# Patient Record
Sex: Female | Born: 1970 | Race: White | Hispanic: No | Marital: Married | Smoking: Current every day smoker
Health system: Southern US, Community
[De-identification: ages and names within clinical notes are randomized; demographics above are authoritative.]

## PROBLEM LIST (undated history)

## (undated) DIAGNOSIS — Z8632 Personal history of gestational diabetes: Secondary | ICD-10-CM

## (undated) DIAGNOSIS — F1910 Other psychoactive substance abuse, uncomplicated: Secondary | ICD-10-CM

## (undated) DIAGNOSIS — N946 Dysmenorrhea, unspecified: Secondary | ICD-10-CM

## (undated) DIAGNOSIS — Z972 Presence of dental prosthetic device (complete) (partial): Secondary | ICD-10-CM

## (undated) DIAGNOSIS — D649 Anemia, unspecified: Secondary | ICD-10-CM

## (undated) DIAGNOSIS — M5030 Other cervical disc degeneration, unspecified cervical region: Secondary | ICD-10-CM

## (undated) DIAGNOSIS — A64 Unspecified sexually transmitted disease: Secondary | ICD-10-CM

## (undated) DIAGNOSIS — J45909 Unspecified asthma, uncomplicated: Secondary | ICD-10-CM

## (undated) DIAGNOSIS — D069 Carcinoma in situ of cervix, unspecified: Secondary | ICD-10-CM

## (undated) DIAGNOSIS — I10 Essential (primary) hypertension: Secondary | ICD-10-CM

## (undated) DIAGNOSIS — N920 Excessive and frequent menstruation with regular cycle: Secondary | ICD-10-CM

## (undated) DIAGNOSIS — Z973 Presence of spectacles and contact lenses: Secondary | ICD-10-CM

## (undated) DIAGNOSIS — F1010 Alcohol abuse, uncomplicated: Secondary | ICD-10-CM

## (undated) HISTORY — DX: Anemia, unspecified: D64.9

## (undated) HISTORY — DX: Essential (primary) hypertension: I10

## (undated) HISTORY — DX: Other psychoactive substance abuse, uncomplicated: F19.10

## (undated) HISTORY — DX: Unspecified sexually transmitted disease: A64

---
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
## 1997-11-04 ENCOUNTER — Other Ambulatory Visit: Admission: RE | Admit: 1997-11-04 | Discharge: 1997-11-04 | Payer: Self-pay | Admitting: Obstetrics and Gynecology

## 1998-11-23 ENCOUNTER — Other Ambulatory Visit: Admission: RE | Admit: 1998-11-23 | Discharge: 1998-11-23 | Payer: Self-pay | Admitting: Obstetrics and Gynecology

## 1998-12-17 ENCOUNTER — Encounter (INDEPENDENT_AMBULATORY_CARE_PROVIDER_SITE_OTHER): Payer: Self-pay

## 1998-12-17 ENCOUNTER — Other Ambulatory Visit: Admission: RE | Admit: 1998-12-17 | Discharge: 1998-12-17 | Payer: Self-pay | Admitting: Obstetrics and Gynecology

## 1999-08-04 ENCOUNTER — Other Ambulatory Visit: Admission: RE | Admit: 1999-08-04 | Discharge: 1999-08-04 | Payer: Self-pay | Admitting: Obstetrics and Gynecology

## 1999-11-29 ENCOUNTER — Other Ambulatory Visit: Admission: RE | Admit: 1999-11-29 | Discharge: 1999-11-29 | Payer: Self-pay | Admitting: Obstetrics and Gynecology

## 2000-05-01 ENCOUNTER — Inpatient Hospital Stay (HOSPITAL_COMMUNITY): Admission: AD | Admit: 2000-05-01 | Discharge: 2000-05-01 | Payer: Self-pay | Admitting: Obstetrics and Gynecology

## 2000-07-20 ENCOUNTER — Inpatient Hospital Stay (HOSPITAL_COMMUNITY): Admission: AD | Admit: 2000-07-20 | Discharge: 2000-07-21 | Payer: Self-pay | Admitting: Obstetrics and Gynecology

## 2000-07-20 ENCOUNTER — Encounter (INDEPENDENT_AMBULATORY_CARE_PROVIDER_SITE_OTHER): Payer: Self-pay | Admitting: *Deleted

## 2000-07-23 ENCOUNTER — Encounter: Admission: RE | Admit: 2000-07-23 | Discharge: 2000-08-22 | Payer: Self-pay | Admitting: Obstetrics and Gynecology

## 2000-08-23 ENCOUNTER — Encounter: Admission: RE | Admit: 2000-08-23 | Discharge: 2000-09-22 | Payer: Self-pay | Admitting: Obstetrics and Gynecology

## 2000-12-20 ENCOUNTER — Other Ambulatory Visit: Admission: RE | Admit: 2000-12-20 | Discharge: 2000-12-20 | Payer: Self-pay | Admitting: Obstetrics and Gynecology

## 2001-07-02 ENCOUNTER — Encounter: Payer: Self-pay | Admitting: Internal Medicine

## 2001-07-02 ENCOUNTER — Encounter: Admission: RE | Admit: 2001-07-02 | Discharge: 2001-07-02 | Payer: Self-pay | Admitting: Internal Medicine

## 2002-01-23 ENCOUNTER — Other Ambulatory Visit: Admission: RE | Admit: 2002-01-23 | Discharge: 2002-01-23 | Payer: Self-pay | Admitting: Obstetrics and Gynecology

## 2003-11-24 ENCOUNTER — Encounter: Admission: RE | Admit: 2003-11-24 | Discharge: 2003-11-24 | Payer: Self-pay | Admitting: Internal Medicine

## 2004-08-01 ENCOUNTER — Emergency Department (HOSPITAL_COMMUNITY): Admission: EM | Admit: 2004-08-01 | Discharge: 2004-08-01 | Payer: Self-pay | Admitting: Emergency Medicine

## 2004-11-26 ENCOUNTER — Encounter: Admission: RE | Admit: 2004-11-26 | Discharge: 2004-11-26 | Payer: Self-pay | Admitting: Internal Medicine

## 2004-12-28 ENCOUNTER — Other Ambulatory Visit: Admission: RE | Admit: 2004-12-28 | Discharge: 2004-12-28 | Payer: Self-pay | Admitting: Internal Medicine

## 2005-09-23 ENCOUNTER — Emergency Department (HOSPITAL_COMMUNITY): Admission: EM | Admit: 2005-09-23 | Discharge: 2005-09-23 | Payer: Self-pay | Admitting: Emergency Medicine

## 2005-10-09 ENCOUNTER — Emergency Department (HOSPITAL_COMMUNITY): Admission: EM | Admit: 2005-10-09 | Discharge: 2005-10-10 | Payer: Self-pay | Admitting: Emergency Medicine

## 2005-10-21 ENCOUNTER — Ambulatory Visit (HOSPITAL_COMMUNITY): Admission: RE | Admit: 2005-10-21 | Discharge: 2005-10-21 | Payer: Self-pay | Admitting: Orthopaedic Surgery

## 2006-01-17 DIAGNOSIS — F319 Bipolar disorder, unspecified: Secondary | ICD-10-CM

## 2006-01-17 HISTORY — DX: Bipolar disorder, unspecified: F31.9

## 2006-08-17 ENCOUNTER — Inpatient Hospital Stay (HOSPITAL_COMMUNITY): Admission: AC | Admit: 2006-08-17 | Discharge: 2006-08-20 | Payer: Self-pay

## 2006-08-17 ENCOUNTER — Ambulatory Visit: Payer: Self-pay | Admitting: Family Medicine

## 2007-09-03 IMAGING — CR DG CHEST 1V PORT
1 series · 1 of 1 positions shown · non-contrast
Comparison: 08/17/06.

CLINICAL DATA: Ventilator patient. Pneumonia.
 PORTABLE CHEST - 1 VIEW (1220 hours):

[view not recorded]
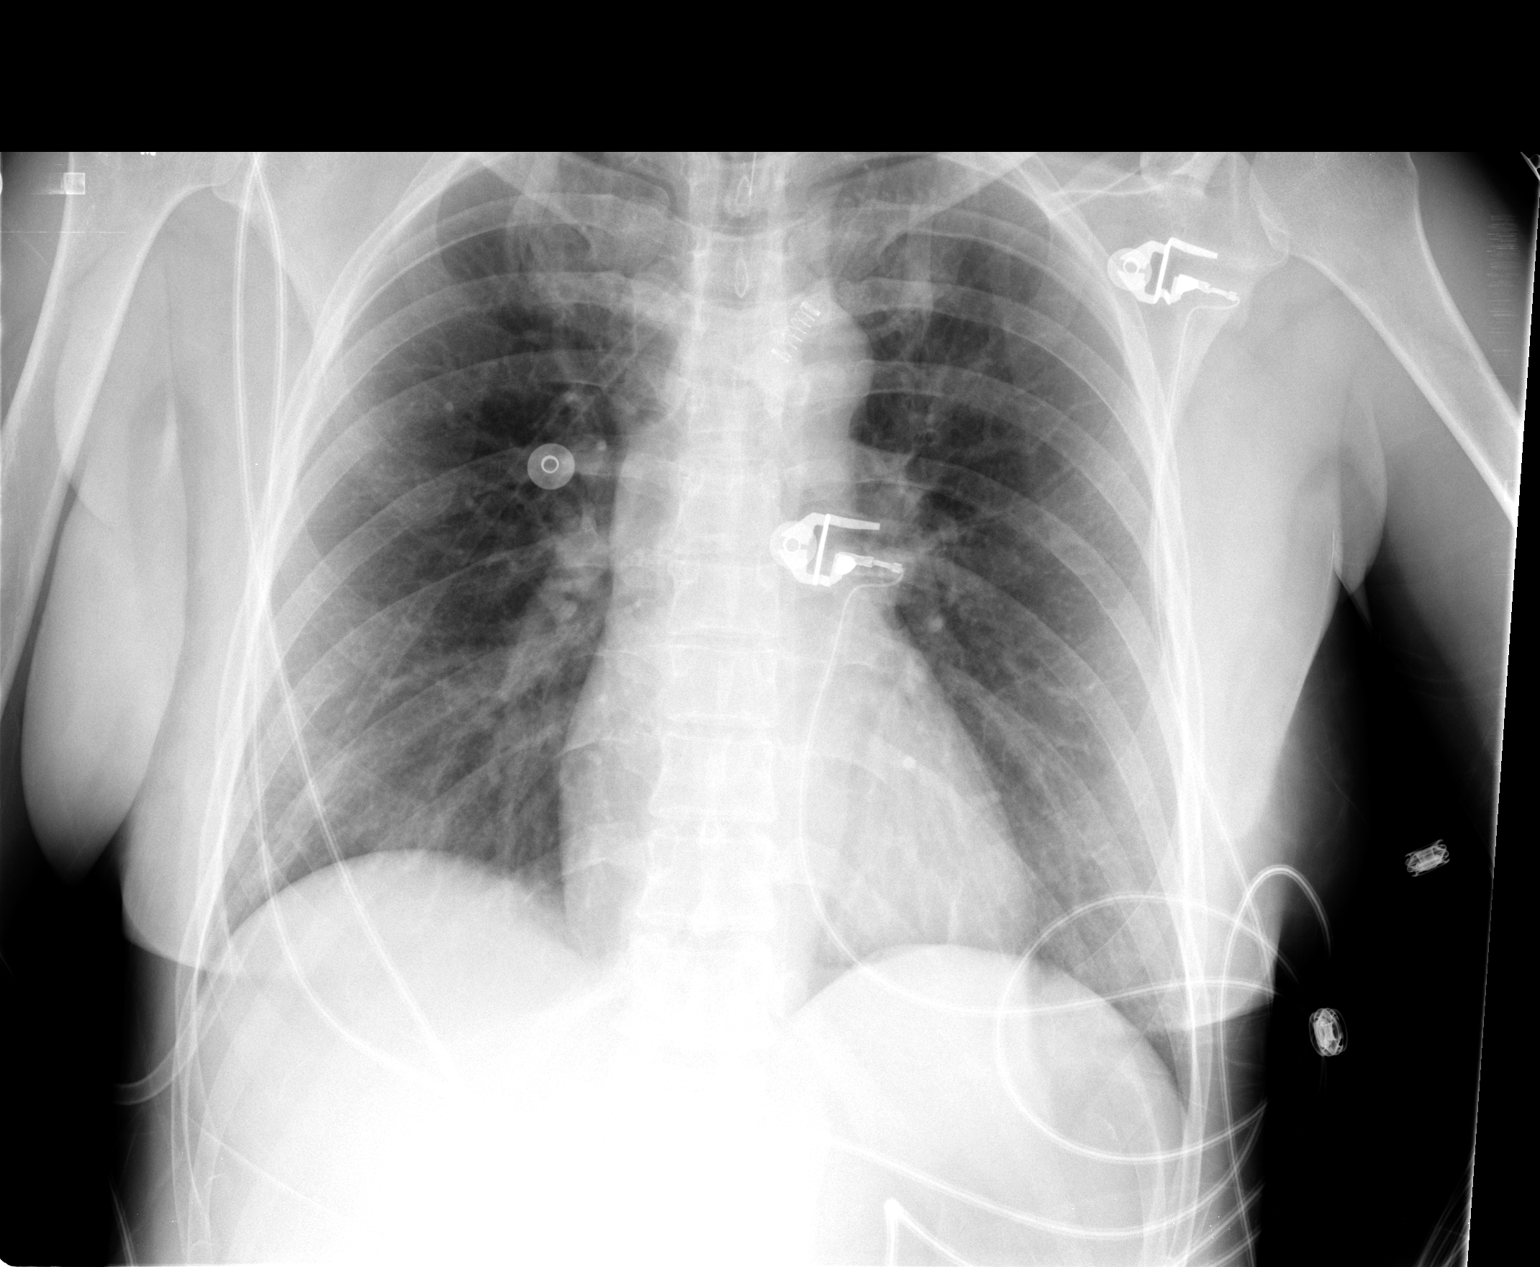

[1 of 1 positions shown; findings below may reference images not displayed]

FINDINGS: The endotracheal tube is in the mid trachea. The cardiomediastinal contours are stable. The lungs now appear clear. There is no pleural effusion or pneumothorax. No fractures are seen.
IMPRESSION: 1.   Endotracheal tube is well positioned.
 2.  No acute chest findings.

## 2007-12-10 ENCOUNTER — Ambulatory Visit: Payer: Self-pay | Admitting: Internal Medicine

## 2007-12-10 ENCOUNTER — Other Ambulatory Visit: Admission: RE | Admit: 2007-12-10 | Discharge: 2007-12-10 | Payer: Self-pay | Admitting: Internal Medicine

## 2008-02-08 ENCOUNTER — Ambulatory Visit: Payer: Self-pay | Admitting: Internal Medicine

## 2008-04-07 ENCOUNTER — Ambulatory Visit: Payer: Self-pay | Admitting: Internal Medicine

## 2008-10-24 ENCOUNTER — Ambulatory Visit: Payer: Self-pay | Admitting: Internal Medicine

## 2008-12-04 ENCOUNTER — Ambulatory Visit: Payer: Self-pay | Admitting: Internal Medicine

## 2008-12-04 ENCOUNTER — Other Ambulatory Visit: Admission: RE | Admit: 2008-12-04 | Discharge: 2008-12-04 | Payer: Self-pay | Admitting: Internal Medicine

## 2010-06-01 NOTE — Consult Note (Signed)
Destiny Travis, Destiny Travis              ACCOUNT NO.:  [id number]   MEDICAL RECORD NO.:  [id number]          PATIENT TYPE:  INP   LOCATION:  2116                         FACILITY:  [facility]   PHYSICIAN:  Pearlean Brownie, M.D.DATE OF BIRTH:  [birth year]   DATE OF CONSULTATION:  08/18/2006  DATE OF DISCHARGE:                                 CONSULTATION   PRIMARY CARE PHYSICIAN:  Dr. Lenord Fellers, who is medicine/pediatrics  in  [place].   PSYCHIATRIST:  Dr. Hortencia Pilar.   CHIEF COMPLAINT:  Found down after a fall, questionable seizure  activity.   HISTORY OF PRESENT ILLNESS:  Destiny Travis is a 40 year old with recent  diagnosis of bipolar and has been on lithium for 1 week.  She presented  to the emergency department via EMS after being found down with altered  mental status per report.  The patient was unresponsive and fell down  the steps around 20:30 and was found by her son.  Per EMS report, her  Glasgow Score on admission was 3, with some eyelid fluttering noted.  The patient was intubated in the ED secondary to concern for airway  protection.   PAST MEDICAL HISTORY:  1. Bipolar diagnosed 1 week ago.  2. Extensive history of substance abuse, including alcohol as well as      cocaine.  3. The patient has been in outpatient rehab at the [facility] from      October 2007 through December 2007, and most recently February 2008      through April 2008.  Patient also enrolled in Alcoholics Anonymous      from June 2008 and July 2008.  4. Has a significant history of depression.   ALLERGIES:  PENICILLIN and ERYTHROMYCIN, which cause shock per husband.   CURRENT MEDICATION BOTTLES:  1. Lithium 300 mg 1 tablet p.o. at bedtime, and written to increase to      2 tablets p.o. at bedtime.  2. Zoloft 25 mg p.o. daily.   SOCIAL HISTORY:  The patient lives with her husband.  They have an  adopted son who is 93 years old, a 35 year old son, as well as a 6-year-  old daughter.  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  Urine drug screen was positive for marijuana as well as cocaine today,  and alcohol level was elevated.  The patient's husband denies any  suicide attempts in the past, but states that she has struggled with  depression for the last 4-5 years.   FAMILY HISTORY:  Noncontributory.   PHYSICAL EXAMINATION:  VITAL SIGNS:  Temperature 97.8, blood pressure  158/101, heart rate 65, respirations 18.  The patient is saturating 100%  on ACVC, with a rate of 12, tidal volume of 550, FIO2 of 40%, and a PEEP  of 5.  GENERAL:  The patient opens her eyes spontaneously.  Mental status waxes  and wanes.  She is attempting to follow simple commands.  CARDIAC:  Bradycardic, with a rate of 60-65 beats per minute.  No  murmurs.  PULMONARY:  Occasional crackles on the left upper lobe were noted.  ABDOMEN:  Decreased bowel sounds, soft and nontender.  EXTREMITIES:  No edema.  No excoriations noted on the skin.  NEUROLOGIC:  Pupils equal, round, and reactive to light and  accommodation from 2 to 1 bilaterally.  Deep tendon reflexes are  present.  No Babinski's noted.   LABORATORY WORK:  Creatinine 0.9.  White blood cell count 6.8,  hemoglobin 14.8, platelets 277.  PT 13.7, INR 1.08.  Urinalysis:  Specific gravity of 1.019, negative glucose, negative protein, negative  leukocyte esterase and nitrites, 0-2 white blood cells and red blood  cells.  Alcohol level 143.  Urine drug screen positive for cocaine and  THC.  CT of the head and spine:  No acute intracranial findings.  No  evidence of acute fracture.  No subluxation.  No signs of instability.  There are some degenerative changes noted at C5 and C6.  Her EKG shows  normal sinus rhythm at 69 beats per minute, with a PR of 136 and a QT of  408.  Ammonia level is pending.  CMET is pending.  Urine pregnancy is  pending.  Fingerstick upon admission 125.  The patient's chest x-ray  revealed ET tube at the tip of the clavicle,  5.5 cm above the carina.  There is a hazy air space opacity on the left mid-upper lobe which could  possibly represent a contusion.   ASSESSMENT AND PLAN:  1. This is 40 year old.  2. Ventilator-dependent respiratory failure secondary to altered      mental status.  Most likely etiology is secondary to substance      induced.  Differential includes lithium toxicity as well as      overdose and possible alcohol withdrawal as well.  Given that her      UDS is positive for cocaine, we will cycle her cardiac enzymes q.8      h.. x3.  Obtain baseline EKG to ensure she does not have acute      coronary syndrome or prolonged QRS and QTc.  Given that she may      have a possible ingestion, we will check a Tylenol and salicylate      level as well as an osmole gap.  We will give her thiamine, given      that there is some concern for alcohol history, followed by folate,      and we will watch for signs of withdrawal and hold any sedative.      Her electrolytes are pending.  We will add an ammonia to ensure      that the patient does not have hepatic encephalopathy.  Her ABG was      stable on her initial vent settings, with pH of 7.4, a CO2 of 38.      Her initial head CT did not show any acute intracranial findings,      and no areas of acute fracture or subluxation in her cervical      spine.  She is currently in a cervical collar until she is cleared      by the trauma service.  The patient will likely need an extensive      psychiatric evaluation upon awakening, given her dual diagnosis of      bipolar and substance abuse.      Alanson Puls, M.D.  Electronically Signed      Pearlean Brownie, M.D.  Electronically Signed    MR/MEDQ  D:  08/18/2006  T:  08/18/2006  Job:  [id number]

## 2010-06-01 NOTE — Consult Note (Signed)
NAMETALINA, PLEITEZ              ACCOUNT NO.:  [id number]   MEDICAL RECORD NO.:  [id number]          PATIENT TYPE:  INP   LOCATION:  3731                         FACILITY:  [facility]   PHYSICIAN:  Antonietta Breach, M.D.  DATE OF BIRTH:  Jul 26, 1970   DATE OF CONSULTATION:  08/18/2006  DATE OF DISCHARGE:                                 CONSULTATION   REQUESTING PHYSICIAN:  Marta Lamas. Lindie Spruce, M.D.   REASON FOR CONSULTATION:  Potential complications from psychotropic  medication, relapse.   HISTORY OF PRESENT ILLNESS:  Destiny Travis is a 40 year old female  admitted to the [facility] on July 31 due to being unresponsive  status post fall.   The patient may have had a seizure.  She had been recently started on  lithium and Zoloft by her outpatient psychiatrist.  She has relapsed on  cocaine.  Her urine drug screen was positive for cocaine as well as  tetrahydrocannabinol.  Her alcohol level was still 143 by the time of  evaluation.   The patient does acknowledge depressed mood.  She has decreased energy  and decreased concentration; however, she denies thoughts of harming  herself or others.  She denies hallucinations or delusions.   PAST PSYCHIATRIC HISTORY:  The patient does describe several-day periods  of decreased need for sleep and increased energy as well as impaired  judgment and behaviors of risk while not under the influence of  stimulants.   As mentioned, she was started on lithium and Zoloft by her outpatient  psychiatrist within the past 2 weeks.  She denies any history of suicide  attempts.   She has recently gotten out of jail for embezzlement.   FAMILY PSYCHIATRIC HISTORY:  None known.   SOCIAL HISTORY:  The patient is married.  She and her husband are having  progressive arguments over her behavior and repeated cocaine relapse.  She states that he has decided that the marriage is over.  She has  children.  She is unemployed.  Her adopted son is 44 years old.  She  has  a 38 year old son as well as a 70-year-old daughter.  She was working as  a Social worker in a Administrator.   PAST MEDICAL HISTORY:  Fall with obtundation, possible seizure.   MEDICATIONS:  The MAR is reviewed.  The patient is on Haldol 2-8 mg IV  q.2h. p.r.n.   She has no known drug allergies.   LABORATORY DATA:  Urine HCG is negative.  Please see the above  discussion in the history of present illness.   REVIEW OF SYSTEMS:  Noncontributory.   MENTAL STATUS EXAM:  Ms. Starn is alert at the time of the exam.  Her  attention span is within normal limits.  Her concentration is mildly  decreased.  She is oriented to all spheres.  Her memory is intact to  immediate, recent and remote except for a blackout period which is  estimated at 12 hours.  This blackout period ended today earlier.  Her  fund of knowledge and intelligence are within normal limits.  Speech  involves normal rate and slightly  flat prosody.  Thought process:  Logical, coherent, goal-directed.  No looseness of associations.  Her  thought content:  No thoughts of harming herself, no thoughts of harming  others, no delusions, no hallucinations.  Insight is partial.  Judgment  is intact for the need of treatment.   ASSESSMENT:  AXIS I:  Rule out 296.80, bipolar order not otherwise  specified, depressed.  Polysubstance dependence.  AXIS II:  Deferred.  AXIS III:  See general medical problems.  AXIS IV:  Marital, addiction.  AXIS V:  35.   Ms. Cenci is in a pattern of symptoms of behavior that could result  in increased morbidity and potentially mortality.  She has depression to  the point that she would have difficulty with activities of daily  living.  Therefore, the undersigned recommends that she be admitted to a  psychiatric inpatient unit for a dual-diagnosis track.  The patient  concurs with this recommendation.   Would admit the patient to a psychiatric unit for further evaluation and  treatment as  described above when she is medically cleared.   Options include [facility], [place], or [facility].  The social worker can call [phone] for information on county  sponsorship.      Antonietta Breach, M.D.  Electronically Signed     JW/MEDQ  D:  08/19/2006  T:  08/19/2006  Job:  [id number]

## 2010-06-01 NOTE — H&P (Signed)
Destiny Travis              ACCOUNT NO.:  [id number]   MEDICAL RECORD NO.:  [id number]          PATIENT TYPE:  EMS   LOCATION:  MAJO                         FACILITY:  [facility]   PHYSICIAN:  Sharlet Salina T. Hoxworth, M.D.DATE OF BIRTH:  10/19/1969   DATE OF ADMISSION:  08/17/2006  DATE OF DISCHARGE:                              HISTORY & PHYSICAL   CHIEF COMPLAINT:  Fall, unresponsive.   PRESENT ILLNESS:  Destiny Travis is a 40 year old white female who was  found unresponsive at the bottom of the steps by family just prior to  being brought to the emergency room as a gold trauma.  Her son thought  he heard her fall.  He found her unresponsive.  EMS was called.  When  they arrived, she was breathing spontaneously but otherwise was  completely unresponsive.  She was brought to [facility] emergency room  as a Gold Trauma.  When I arrived, Dr. Stacie Acres was in the process of  intubating the patient.  At that time she had not yet received any  sedation.  She was completely unresponsive to painful stimuli.  She had  fluttering eyelids and clenched jaw.  Otherwise, no spontaneous  movement.  The extremities appeared flaccid.  The patient was sedated  and intubated due to GCS of 3 and history of possible fall and head  trauma.  Vital signs have been stable throughout.   PAST MEDICAL HISTORY:  Obtained from her husband.  She was diagnosed  with bipolar disorder a week ago and was started on lithium 1 week ago.  There is a history of drug abuse.  She has been in rehab recently.  Denies any other significant medical or surgical illnesses.   CURRENT MEDICATIONS:  1. Zoloft 25 mg q.h.s.  2. Lithium 300 mg q.h.s.   ALLERGIES:  PENICILLIN, E-MYCIN PER HUSBAND.   REVIEW OF SYSTEMS:  Positive psych for history of bipolar disorder, as  above, and history of drug abuse and recent rehab.  Otherwise, entirely  negative for any history of cardiac, pulmonary, GI.  Neurologic illness,  history of  seizures.   PHYSICAL EXAMINATION:  VITAL SIGNS:  Temperature is 97, heart rate 106,  respirations 20, blood pressure 133/90, O2 saturations 100%.  GENERAL:  Well-developed white female, unresponsive, as above.  SKIN:  Warm and dry.  HEENT:  No cranial swelling, bruising, or any evidence of trauma.  Face  without swelling or tenderness.  Pupils were 4 mm and reactive.  Oropharynx clear.  LUNGS:  Breath sounds clear and equal.  Chest without crepitance or  tenderness.  NECK:  Nontender.  No swelling.  Trachea midline.  CARDIOVASCULAR:  Regular tachycardia.  Peripheral pulses intact.  No JVD  or edema.  ABDOMEN:  Flat, soft, no apparent tenderness.  PELVIS:  Stable.  No abnormality.  MUSCULOSKELETAL:  No joint swelling, extremity deformity, crepitance.  BACK:  No stepoff deformity.  NEUROLOGIC:  GCS is 15.  Unresponsive to painful stimuli.  She has  fluttering eyelids bilaterally.  Pupils 4 mm and reactive.  Jaw  initially clenched.  Extremities appear flaccid without spontaneous  movement  initially.   LABORATORY:  CBG was in the 30s.  Electrolytes pending.  CBC normal.   Chest x-ray:  Haziness in the left lung.  Question contusion or  aspiration.  CT scan of the head and C-spine negative except for mild  DJD at C5-C6.  Tox screen returned positive for cocaine, THC.  Alcohol  level 146.   ASSESSMENT AND PLAN:  40 year old female brought in unresponsive,  questionable history of fall.  I find no evidence of trauma on physical  exam or on imaging.  There is possible seizure activity, as described  above.  Question if this is related to cocaine or possible lithium  overdose, as this was started recently.  Trauma seems much less likely.  She has an abnormal chest x-ray, question aspiration.   ADDENDUM:  2 hours after admission, the patient is arousing from  sedation on the ventilator, moving extremities x4, opening eyes  spontaneously without following commands.   The patient will  be admitted initially to the trauma service to the ICU.  Continue ventilator support for now, but it appears that she may be able  to be rapidly weaned from the ventilator.  Lithium level was pending.  Medical evaluation is in progress for other causes of her presentation  and/or seizures.      [name]. Hoxworth, M.D.  Electronically Signed     BTH/MEDQ  D:  08/17/2006  T:  08/18/2006  Job:  [id number]

## 2010-06-04 NOTE — Discharge Summary (Signed)
Destiny Travis, Destiny Travis              ACCOUNT NO.:  [id number]   MEDICAL RECORD NO.:  [id number]          PATIENT TYPE:  INP   LOCATION:  3731                         FACILITY:  [facility]   PHYSICIAN:  Asher Muir, MD   DATE OF BIRTH:  February 15, 1970   DATE OF ADMISSION:  08/17/2006  DATE OF DISCHARGE:  08/20/2006                               DISCHARGE SUMMARY   PRIMARY CARE Kailynn Satterly:  Dr. Lenord Fellers.   CONSULTANTS:  Dr. Jeanie Sewer in psychiatry.   PROCEDURES:  Intubation.   REASON FOR ADMISSION:  Patient is a 40 year old female found down with  altered mental status who had a GCS of 3 when EMS came to pick her up.   DISCHARGE DIAGNOSIS:  Altered mental status.   SECONDARY DIAGNOSES:  Bipolar disorder, recently diagnosed, substance  abuse versus addiction.   PERTINENT LABORATORIES:  Cardiac enzymes were negative x3, creatinine  0.9, UA negative, hemoglobin 14.8.  Urine drug screen:  Positive for  cocaine and marijuana.  Alcohol level:  143.  ABG:  PH 7.4, PCO2 38, PO2  501 and that was I believe intubated.  CT head and spine showed no acute  intracranial findings.  No evidence of acute fracture or subluxation or  instability.  Chest x-ray showed that endotracheal tube tip at the  clavicle head 5.5 cm above the corona and hazy air space opacities in  the left middle and upper line, could be contusion.  EKG showed normal  sinus rhythm at 69, QT 408.   DISCHARGE MEDICATIONS:  Zoloft 25 mg p.o. daily, lithium 300 mg p.o.  q.h.s.   HOSPITAL COURSE:  1. Altered mental status.  Patient was initially admitted to the      Trauma Service.  She was intubated for airway protection.  This was      managed by the Trauma Team.  They were able to extubate her on day      #2 of her admission, and her altered mental status may have been      secondary to a fall or more likely acute intoxication, as she was      positive for cocaine and alcohol and marijuana.  With the history      of depression and  bipolar, there was some concern that it may be a      suicide attempt.  Not likely to be lithium toxicity, as her lithium      level was less than 0.25.  Dr. Jeanie Sewer, psychiatrist, was called      in for a consult.  He evaluated the patient and recommended      inpatient rehab treatment program of dual diagnoses treating both      mental health issues and substance abuse.  This was presented to      the patient and to her husband, and they discussed options with the      social worked and decided that they would rather do outpatient      treatment specifically NA or AA, and they understand that the      recommendation from both the psychiatrist and the team was for  inpatient treatment.  Additionally, during her hospitalization,      there was some concern for possible alcohol withdrawal.  We do not      know how much alcohol she consumes on a normal basis.  A small dose      of Ativan was prescribed, and there were never any signs of alcohol      withdrawal.  Her vital signs all remained stable and primarily      within normal limits with the exception of 1 systolic blood      pressure of 172 and 1 diastolic blood pressure of 92.  All 3 sets      of cardiac enzymes were negative.   1. Pain.  Patient did have some headache and chest wall pain.  Chest      wall pain was likely secondary to the fall.  She was given Tylenol      and ibuprofen, which seemed to improve the pain.   1. Hypokalemia.  On the third day of hospitalization, the patient was      found to be slightly hypokalemic at 3.3.  She was given some      potassium chloride, and her potassium improved to within normal      limits.   1. Increased ammonia.  On day 3 of hospitalization, the patient's      ammonia was mildly increased at 44.  However, on the day of      discharge, it had trended down back to within normal limits at 31.   PATIENT CONDITION AT THE TIME OF DISCHARGE:  Stable.   PENDING TEST RESULTS AT THE  TIME OF DISCHARGE:  None.   DISPOSITION:  The patient was discharged home, as she chose not to go to  inpatient rehab.   DISCHARGE FOLLOWUP:  Patient was discharged on a Sunday.  Therefore,  unable to make appointments for her.  Patient was instructed to make an  appointment with her primary care Deronte Solis, Dr. Lenord Fellers, within the next  10 days.  Patient was strongly encouraged to make an appointment with  Dr. Hortencia Pilar, her psychiatrist at [facility], within 1  week.  Both those phone numbers, the patient has them, and they were  also placed on her discharge instruction form.   FOLLOWUP ISSUES:  Clearly this patient needs psychiatric treatment  including treatment for her depression and bipolar disorder and for her  substance abuse.  Whether or not she will seek this treatment is  questionable.  Of note, patient did not endorse suicidal thoughts or  action plan, but clearly she is in need of treatment in this area.      Asher Muir, MD  Electronically Signed     SO/MEDQ  D:  08/21/2006  T:  08/21/2006  Job:  [id number]   cc:   Luanna Cole. Lenord Fellers, M.D.  psychiatrist, [facility] Dr. Hortencia Pilar
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
## 2010-11-01 LAB — CARDIAC PANEL(CRET KIN+CKTOT+MB+TROPI)
CK, MB: 0.9
CK, MB: 1.4
Relative Index: INVALID
Troponin I: <0.01
Troponin I: <0.01

## 2010-11-01 LAB — COMPREHENSIVE METABOLIC PANEL
ALT: 12
ALT: 13
AST: 15
AST: 16
Albumin: 3.1 — ABNORMAL LOW
Albumin: 3.6
Alkaline Phosphatase: 65
Alkaline Phosphatase: 74
Chloride: 110
Chloride: 112
GFR calc Af Amer: >60
GFR calc Af Amer: >60
Potassium: 3.3 — ABNORMAL LOW
Potassium: 3.4 — ABNORMAL LOW
Sodium: 140
Sodium: 142
Total Bilirubin: 0.6
Total Bilirubin: 0.9

## 2010-11-01 LAB — I-STAT 8, (EC8 V) (CONVERTED LAB)
Bicarbonate: 23.4
Glucose, Bld: 101 — ABNORMAL HIGH
Sodium: 142
TCO2: 24
pCO2, Ven: 33.4 — ABNORMAL LOW
pH, Ven: 7.452 — ABNORMAL HIGH

## 2010-11-01 LAB — BASIC METABOLIC PANEL
BUN: 7
CO2: 26
Calcium: 8.4
Calcium: 8.8
Creatinine, Ser: 0.56
Creatinine, Ser: 0.58
GFR calc non Af Amer: >60
Glucose, Bld: 100 — ABNORMAL HIGH
Glucose, Bld: 91
Potassium: 3.3 — ABNORMAL LOW

## 2010-11-01 LAB — AMMONIA: Ammonia: 31

## 2010-11-01 LAB — CBC
HCT: 37.5
HCT: 38.8
HCT: 43.2
Hemoglobin: 12.2
Hemoglobin: 13.4
Hemoglobin: 14.8
MCHC: 33.8
MCHC: 34.2
MCV: 93.8
MCV: 92.4
Platelets: 217
RDW: 12.9
RDW: 12.9
RDW: 13.2
WBC: 13.2 — ABNORMAL HIGH

## 2010-11-01 LAB — CK TOTAL AND CKMB (NOT AT ARMC)
CK, MB: 1.4
Relative Index: 1.3

## 2010-11-01 LAB — POCT I-STAT 3, ART BLOOD GAS (G3+)
Acid-Base Excess: 1
Bicarbonate: 25.5 — ABNORMAL HIGH
O2 Saturation: 100
Operator id: 155271
pCO2 arterial: 38.7
pO2, Arterial: 501 — ABNORMAL HIGH

## 2010-11-01 LAB — URINALYSIS, MICROSCOPIC ONLY
Bilirubin Urine: NEGATIVE
Glucose, UA: NEGATIVE
Ketones, ur: NEGATIVE
Specific Gravity, Urine: 1.019
pH: 5.5

## 2010-11-01 LAB — RAPID URINE DRUG SCREEN, HOSP PERFORMED
Cocaine: POSITIVE — AB
Opiates: NOT DETECTED

## 2010-11-01 LAB — TROPONIN I: Troponin I: 0.01

## 2010-11-01 LAB — CULTURE, BLOOD (ROUTINE X 2)
Culture: NO GROWTH
Culture: NO GROWTH

## 2010-11-01 LAB — PREGNANCY, URINE: Preg Test, Ur: NEGATIVE

## 2010-11-01 LAB — POCT I-STAT CREATININE: Operator id: 272551

## 2010-11-01 LAB — OSMOLALITY: Osmolality: 310 — ABNORMAL HIGH

## 2013-07-15 ENCOUNTER — Other Ambulatory Visit: Payer: Self-pay | Admitting: Family Medicine

## 2013-07-15 DIAGNOSIS — Z1231 Encounter for screening mammogram for malignant neoplasm of breast: Secondary | ICD-10-CM

## 2013-07-31 ENCOUNTER — Ambulatory Visit: Payer: Self-pay

## 2018-03-21 ENCOUNTER — Emergency Department (HOSPITAL_COMMUNITY)
Admission: EM | Admit: 2018-03-21 | Discharge: 2018-03-21 | Disposition: A | Discharge status: Home / Self Care | Payer: BLUE CROSS/BLUE SHIELD | Attending: Emergency Medicine | Admitting: Emergency Medicine

## 2018-03-21 ENCOUNTER — Emergency Department (HOSPITAL_COMMUNITY): Payer: BLUE CROSS/BLUE SHIELD

## 2018-03-21 ENCOUNTER — Other Ambulatory Visit: Payer: Self-pay

## 2018-03-21 ENCOUNTER — Encounter (HOSPITAL_COMMUNITY): Payer: Self-pay | Admitting: *Deleted

## 2018-03-21 DIAGNOSIS — R110 Nausea: Secondary | ICD-10-CM | POA: Diagnosis not present

## 2018-03-21 DIAGNOSIS — Z79899 Other long term (current) drug therapy: Secondary | ICD-10-CM | POA: Insufficient documentation

## 2018-03-21 DIAGNOSIS — N83201 Unspecified ovarian cyst, right side: Secondary | ICD-10-CM | POA: Insufficient documentation

## 2018-03-21 DIAGNOSIS — F17228 Nicotine dependence, chewing tobacco, with other nicotine-induced disorders: Secondary | ICD-10-CM | POA: Diagnosis not present

## 2018-03-21 DIAGNOSIS — F17210 Nicotine dependence, cigarettes, uncomplicated: Secondary | ICD-10-CM | POA: Insufficient documentation

## 2018-03-21 DIAGNOSIS — F1210 Cannabis abuse, uncomplicated: Secondary | ICD-10-CM | POA: Insufficient documentation

## 2018-03-21 DIAGNOSIS — R109 Unspecified abdominal pain: Secondary | ICD-10-CM | POA: Insufficient documentation

## 2018-03-21 LAB — URINALYSIS, ROUTINE W REFLEX MICROSCOPIC
BILIRUBIN URINE: NEGATIVE
GLUCOSE, UA: NEGATIVE mg/dL
HGB URINE DIPSTICK: NEGATIVE
Ketones, ur: NEGATIVE mg/dL
Leukocytes,Ua: NEGATIVE
Nitrite: NEGATIVE
Protein, ur: NEGATIVE mg/dL
SPECIFIC GRAVITY, URINE: 1.018 (ref 1.005–1.030)
pH: 7 (ref 5.0–8.0)

## 2018-03-21 LAB — PREGNANCY, URINE: PREG TEST UR: NEGATIVE

## 2018-03-21 MED ORDER — LIDOCAINE 5 % EX PTCH: 1.0000 | MEDICATED_PATCH | CUTANEOUS | 0 refills | Status: DC

## 2018-03-21 MED ORDER — ONDANSETRON 4 MG PO TBDP
4.0000 mg | ORAL_TABLET | Freq: Once | ORAL | Status: AC
  Administered 2018-03-21: 4 mg via ORAL
  Filled 2018-03-21: qty 1

## 2018-03-21 MED ORDER — OXYCODONE-ACETAMINOPHEN 5-325 MG PO TABS
1.0000 | ORAL_TABLET | ORAL | Status: AC | PRN
  Administered 2018-03-21 (×2): 1 via ORAL
  Filled 2018-03-21 (×2): qty 1

## 2018-03-21 NOTE — ED Triage Notes (Signed)
Pt reports R flank pain x 1 month, the severity comes in waves.  She have been treated for UTI recently and have been on abx.  She went to her PCP today for same and was instructed to come to the ED to r/o kidney stones.  She reports nausea but no vomiting.

## 2018-03-21 NOTE — ED Notes (Signed)
Pain medication given in Triage. Patient advised about side effects of medications and  to avoid driving for a minimum of 4 hours.  Pt verbalized understanding. 

## 2018-03-21 NOTE — ED Notes (Signed)
US at bedside

## 2018-03-21 NOTE — ED Provider Notes (Signed)
[facility]-EMERGENCY DEPT Provider Note   CSN: [id number] Arrival date & time: 03/21/18  1602    History   Chief Complaint Chief Complaint  Patient presents with  . Flank Pain    HPI Destiny Travis is a 48 y.o. female who presents for evaluation of 1 month of her right flank pain.  Patient reports that approxi-1 month ago when symptoms started, she saw her primary care doctor.  At the time, she was diagnosed a UTI was given antibiotics which she completed.  Patient states that she felt better for about a day and then her symptoms returned.  She was given a second antibiotic which she states she completed.  Patient reports about a week ago, her flank pain started worsening and becoming more severe.  She states that it is kind of a constant "ache with an intermittent sharp pain that lasts for about 3 to 4 minutes."  She states that this pain is worse with movement and bending down.  Patient also reports that over the last week, she has had some decrease sensation to her right paraspinal muscles into the top part of her thigh.  She states she still been able to walk without any difficulty denies any urinary or bowel incontinence, saddle anesthesia.  She states she has been taking Robaxin at night with no improvement in her symptoms.  Patient states her urine is still cloudy but she has not had any dysuria, hematuria.  Patient also reports she has been having some intermittent episodes of nausea only with the pain.  She denies any vomiting.  Patient denies any fever, history of IV drug use, history of back surgery, difficulty walking, numbness/weakness of arms or legs, urinary or bowel incontinence, saddle anesthesia, chest pain, difficulty breathing.    The history is provided by the patient.    History reviewed. No pertinent past medical history.  There are no active problems to display for this patient.   History reviewed. No pertinent surgical history.   OB History     No obstetric history on file.      Home Medications    Prior to Admission medications   Medication Sig Start Date End Date Taking? Authorizing Provider  Cholecalciferol (VITAMIN D3) 25 MCG (1000 UT) CAPS Take 1 capsule by mouth daily. 11/30/17  Yes [provider]  diclofenac (VOLTAREN) 75 MG EC tablet Take 75 mg by mouth 2 (two) times daily as needed for mild pain or moderate pain.   Yes [provider]  methocarbamol (ROBAXIN) 500 MG tablet Take 500-1,000 mg by mouth every 6 (six) hours as needed for muscle spasms.  03/09/18  Yes [provider]  lidocaine (LIDODERM) 5 % Place 1 patch onto the skin daily. Remove & Discard patch within 12 hours or as directed by MD 03/21/18   Maxwell Caul, PA-C    Family History No family history on file.  Social History Social History   Tobacco Use  . Smoking status: Current Every Day Smoker    Packs/day: 1.00    Types: Cigarettes  . Smokeless tobacco: Current User  Substance Use Topics  . Alcohol use: Yes    Alcohol/week: 2.0 standard drinks    Types: 2 Standard drinks or equivalent per week  . Drug use: Yes    Types: Marijuana     Allergies   Penicillins and Erythromycin   Review of Systems Review of Systems  Constitutional: Negative for fever.  Respiratory: Negative for cough and shortness of  breath.   Cardiovascular: Negative for chest pain.  Gastrointestinal: Negative for abdominal pain, nausea and vomiting.  Genitourinary: Positive for flank pain. Negative for dysuria and hematuria.  Musculoskeletal: Positive for back pain.  Neurological: Positive for numbness. Negative for weakness and headaches.  All other systems reviewed and are negative.    Physical Exam Updated Vital Signs BP (!) 157/94 (BP Location: Left Arm)   Pulse 78   Temp 98.2 F (36.8 C) (Oral)   Resp 18   SpO2 99%   Physical Exam Vitals signs and nursing note reviewed.  Constitutional:      Appearance: Normal  appearance. She is well-developed.  HENT:     Head: Normocephalic and atraumatic.  Eyes:     General: Lids are normal.     Conjunctiva/sclera: Conjunctivae normal.     Pupils: Pupils are equal, round, and reactive to light.  Neck:     Musculoskeletal: Full passive range of motion without pain. No spinous process tenderness.  Cardiovascular:     Rate and Rhythm: Normal rate and regular rhythm.     Pulses: Normal pulses.          Radial pulses are 2+ on the right side and 2+ on the left side.       Dorsalis pedis pulses are 2+ on the right side and 2+ on the left side.     Heart sounds: Normal heart sounds. No murmur. No friction rub. No gallop.   Pulmonary:     Effort: Pulmonary effort is normal.     Breath sounds: Normal breath sounds.     Comments: Lungs clear to auscultation bilaterally.  Symmetric chest rise.  No wheezing, rales, rhonchi. Abdominal:     Palpations: Abdomen is soft. Abdomen is not rigid.     Tenderness: There is abdominal tenderness in the right lower quadrant. There is right CVA tenderness. There is no guarding. Negative signs include McBurney's sign.  Musculoskeletal: Normal range of motion.     Thoracic back: She exhibits no tenderness.     Lumbar back: She exhibits no tenderness.       Back:     Comments: No midline T or L-spine tenderness.  No deformity or crepitus.  Diffuse muscular tenderness noted to the paraspinal muscles of the lumbar region that extend anteriorly.   Skin:    General: Skin is warm and dry.     Capillary Refill: Capillary refill takes less than 2 seconds.     Comments: No overlying rash.  Neurological:     Mental Status: She is alert and oriented to person, place, and time.     Comments: Follows commands, Moves all extremities  5/5 strength to BUE and BLE  Decreased sensation noted to the right paraspinal muscles that extend just over to the right lower extremity.  Otherwise sensation intact throughout all major nerve  distributions Normal gait Negative SLR  Psychiatric:        Speech: Speech normal.      ED Treatments / Results  Labs (all labs ordered are listed, but only abnormal results are displayed) Labs Reviewed  URINALYSIS, ROUTINE W REFLEX MICROSCOPIC - Abnormal; Notable for the following components:      Result Value   APPearance CLOUDY (*)    All other components within normal limits  PREGNANCY, URINE    EKG None  Radiology US Transvaginal Non-ob  Result Date: 03/21/2018 CLINICAL DATA:  Pelvic pain for 1 month. EXAM: TRANSABDOMINAL AND TRANSVAGINAL ULTRASOUND OF PELVIS TECHNIQUE: Both  transabdominal and transvaginal ultrasound examinations of the pelvis were performed. Transabdominal technique was performed for global imaging of the pelvis including uterus, ovaries, adnexal regions, and pelvic cul-de-sac. It was necessary to proceed with endovaginal exam following the transabdominal exam to visualize the uterus, ovaries, and adnexa. COMPARISON:  None FINDINGS: Uterus Measurements: 6.9 x 3.6 x 4.0 cm = volume: 51.7 mL. No fibroids or other mass visualized. Endometrium Thickness: 3 mm, normal.  No focal abnormality visualized. Right ovary Measurements: 3.1 x 1.1 x 1.7 cm = volume: 2.95 mL. There is a 1.2 cm follicular cyst in the right ovary. Blood flow is noted. No adnexal mass. Left ovary Measurements: 2.6 x 0.9 x 1.8 cm = volume: 2.0 mL. There is a 1.5 cm cyst in the left ovary. Blood flow is noted. No adnexal mass. Other findings No abnormal free fluid. IMPRESSION: 1. Normal sonographic appearance of both ovaries with physiologic follicular cysts, largest measuring 1.5 cm. No dedicated imaging follow-up is needed. 2. Normal sonographic appearance of the uterus and endometrium. Electronically Signed   By: Narda Rutherford M.D.   On: 03/21/2018 21:02   US Pelvis Complete  Result Date: 03/21/2018 CLINICAL DATA:  Pelvic pain for 1 month. EXAM: TRANSABDOMINAL AND TRANSVAGINAL ULTRASOUND OF PELVIS  TECHNIQUE: Both transabdominal and transvaginal ultrasound examinations of the pelvis were performed. Transabdominal technique was performed for global imaging of the pelvis including uterus, ovaries, adnexal regions, and pelvic cul-de-sac. It was necessary to proceed with endovaginal exam following the transabdominal exam to visualize the uterus, ovaries, and adnexa. COMPARISON:  None FINDINGS: Uterus Measurements: 6.9 x 3.6 x 4.0 cm = volume: 51.7 mL. No fibroids or other mass visualized. Endometrium Thickness: 3 mm, normal.  No focal abnormality visualized. Right ovary Measurements: 3.1 x 1.1 x 1.7 cm = volume: 2.95 mL. There is a 1.2 cm follicular cyst in the right ovary. Blood flow is noted. No adnexal mass. Left ovary Measurements: 2.6 x 0.9 x 1.8 cm = volume: 2.0 mL. There is a 1.5 cm cyst in the left ovary. Blood flow is noted. No adnexal mass. Other findings No abnormal free fluid. IMPRESSION: 1. Normal sonographic appearance of both ovaries with physiologic follicular cysts, largest measuring 1.5 cm. No dedicated imaging follow-up is needed. 2. Normal sonographic appearance of the uterus and endometrium. Electronically Signed   By: Narda Rutherford M.D.   On: 03/21/2018 21:02   US Renal  Result Date: 03/21/2018 CLINICAL DATA:  Right-sided flank pain, recurrent UTI EXAM: RENAL / URINARY TRACT ULTRASOUND COMPLETE COMPARISON:  None. FINDINGS: Right Kidney: Renal measurements: 10.4 x 4.4 x 5.6 cm = volume: 133 mL . Echogenicity within normal limits. No mass or hydronephrosis visualized. Left Kidney: Renal measurements: 11.8 x 5 x 5 cm = volume: 155.3 mL. Echogenicity within normal limits. No mass or hydronephrosis visualized. Bladder: Small amount of debris in the bladder. IMPRESSION: 1. Negative ultrasound appearance of the kidneys 2. Small amount of debris in the bladder. Bladder is otherwise normal in appearance. Electronically Signed   By: Jasmine Pang M.D.   On: 03/21/2018 21:03     Procedures Procedures (including critical care time)  Medications Ordered in ED Medications  oxyCODONE-acetaminophen (PERCOCET/ROXICET) 5-325 MG per tablet 1 tablet (1 tablet Oral Given 03/21/18 2216)  ondansetron (ZOFRAN-ODT) disintegrating tablet 4 mg (4 mg Oral Given 03/21/18 1651)     Initial Impression / Assessment and Plan / ED Course  I have reviewed the triage vital signs and the nursing notes.  Pertinent labs &  imaging results that were available during my care of the patient were reviewed by me and considered in my medical decision making (see chart for details).        48 y.o. female who presents for evaluation of 1 month of right flank pain.  She reports worsened over the last week.  She saw PCP diagnosed with UTI and she has been on 2 rounds of antibiotics.  Reports she continues to have right flank pain.  She reports she also has some numbness to that area.  She still been able to ambulate.  No fevers, urinary or bowel incontinence, saddle anesthesia.  She denies any dysuria, hematuria. Patient is afebrile, non-toxic appearing, sitting comfortably on examination table. Vital signs reviewed and stable.  On exam, she has tenderness noted to the right paraspinal muscles of the lumbar region/CVA tenderness.  She reports some decrease sensation to that area but sensation otherwise intact.  No weakness of her bilateral lower extremities.  Negative SLE.  Low suspicion for kidney stone given duration of symptoms x1 month.  Consider musculoskeletal pain.  Also consider ovarian cyst.  Given that this is been ongoing for the last month, do not suspect ovarian torsion.  Additionally, her abdominal exam is not concerning for ovarian torsion.  Urine ordered at triage.  Will plan for ultrasound renal, ultrasound pelvic for evaluation.  Also consider musculoskeletal pain versus sciatica.  History/physical exam not concerning for cauda equina, spinal abscess, aortic dissection.  Ultrasound of  renal shows no evidence of kidney stones.  Pelvic ultrasound shows cyst in right ovary measuring approximate 1.2 cm.  Additionally, there is a cyst in the left ovary measuring 1.5 cm.  No other acute abnormalities.  Discussed results with patient.  Instructed patient to follow-up with her OB/GYN regarding ovarian cyst.  Patient able to ambulate in the ED without any difficulty. At this time, patient exhibits no emergent life-threatening condition that require further evaluation in ED or admission. Patient had ample opportunity for questions and discussion. All patient's questions were answered with full understanding. Strict return precautions discussed. Patient expresses understanding and agreement to plan.   Portions of this note were generated with Scientist, clinical (histocompatibility and immunogenetics). Dictation errors may occur despite best attempts at proofreading.   Final Clinical Impressions(s) / ED Diagnoses   Final diagnoses:  Right flank pain  Cyst of right ovary    ED Discharge Orders         Ordered    lidocaine (LIDODERM) 5 %  Every 24 hours     03/21/18 2200           Maxwell Caul, PA-C 03/22/18 0014    Derwood Kaplan, MD 03/22/18 1514

## 2018-03-21 NOTE — Discharge Instructions (Addendum)
As we discussed, there is no evidence of kidney stones on your ultrasound.  There was a 1.5 cm cyst on the left ovary as well as a 1.2 cm cyst on the right ovary.  This could be contributing to her pain.  Please follow-up with your OB/GYN regarding the symptoms.  You can take Tylenol or Ibuprofen as directed for pain. You can alternate Tylenol and Ibuprofen every 4 hours. If you take Tylenol at 1pm, then you can take Ibuprofen at 5pm. Then you can take Tylenol again at 9pm.   Take Robaxin as prescribed. This medication will make you drowsy so do not drive or drink alcohol when taking it.  Lidocaine patches as directed.  Follow-up with your primary care doctor as directed.  Return emergency part for any fever, worsening pain, difficulty walking, numbness/weakness of your arms or legs or any other worsening or concerning symptoms.

## 2019-04-06 IMAGING — US US TRANSVAGINAL NON-OB
1 series · 13 of 25 positions shown · non-contrast
Comparison: None

CLINICAL DATA: Pelvic pain for 1 month.

EXAM:
TRANSABDOMINAL AND TRANSVAGINAL ULTRASOUND OF PELVIS
TECHNIQUE: Both transabdominal and transvaginal ultrasound examinations of the
pelvis were performed. Transabdominal technique was performed for
global imaging of the pelvis including uterus, ovaries, adnexal
regions, and pelvic cul-de-sac. It was necessary to proceed with
endovaginal exam following the transabdominal exam to visualize the
uterus, ovaries, and adnexa..

[Series 1: us transvaginal non-ob · 13 of 41 slices shown]
[im 1/41]
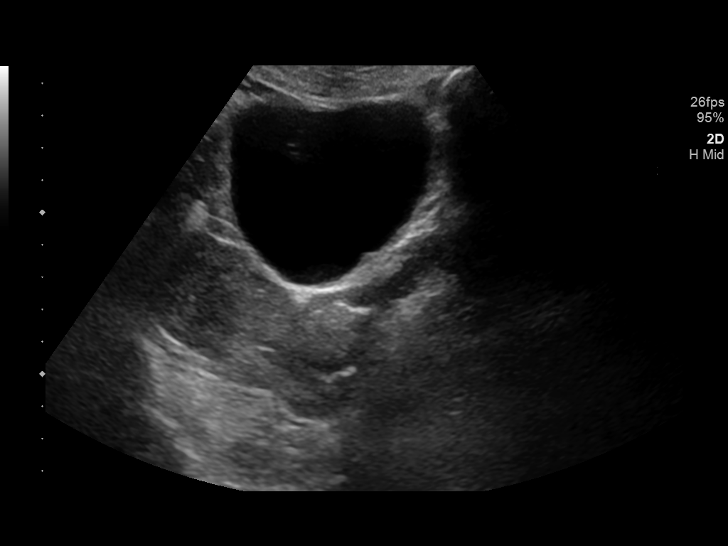
[im 4/41]
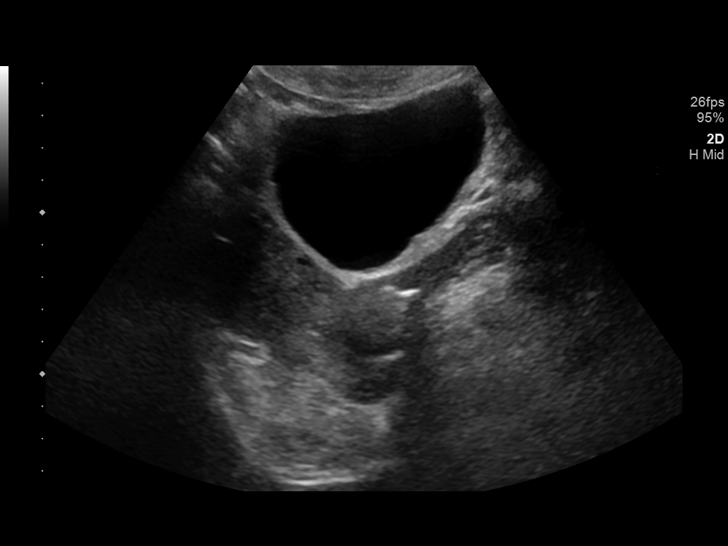
[im 7/41]
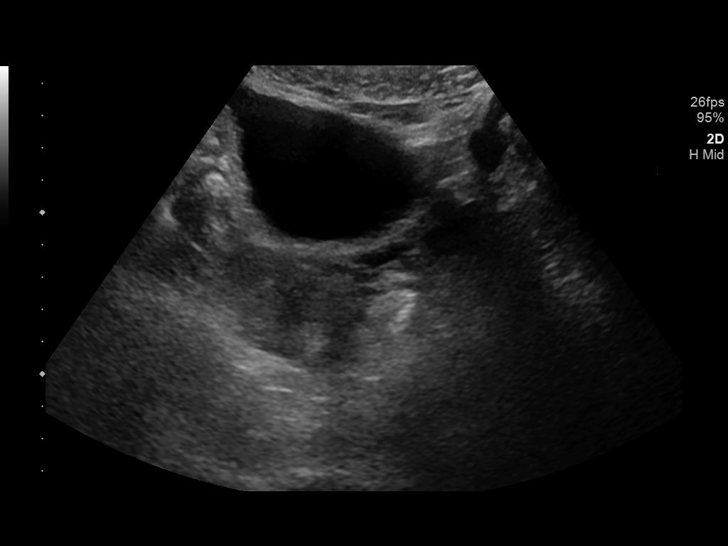
[im 11/41]
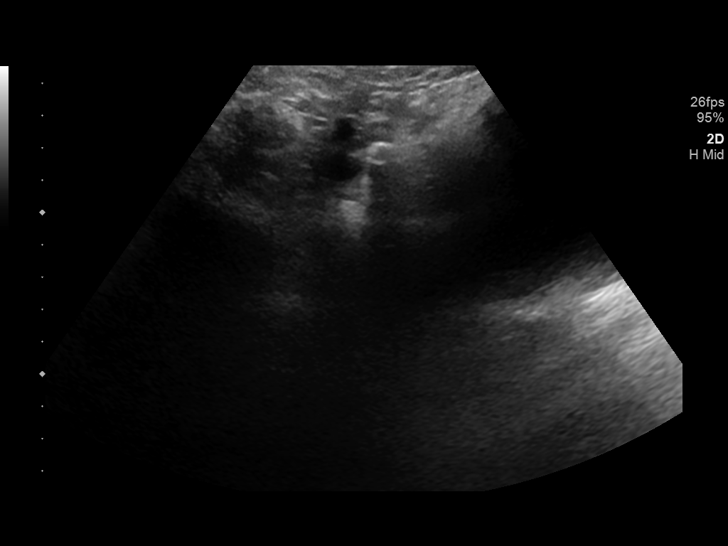
[im 14/41]
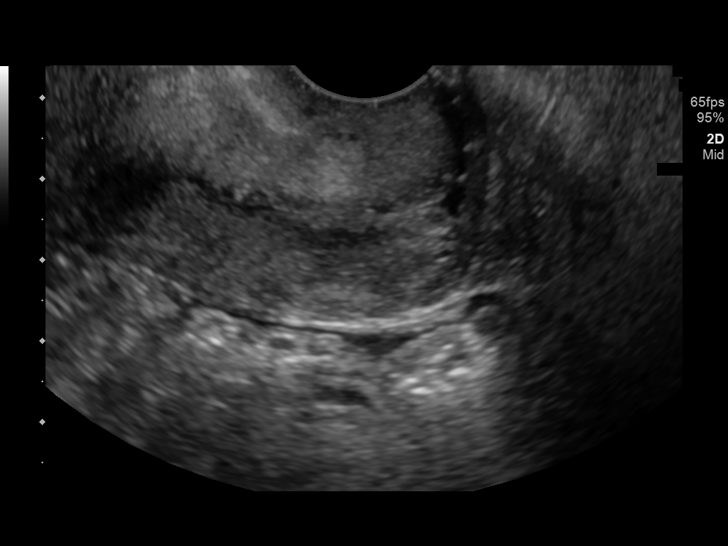
[im 17/41]
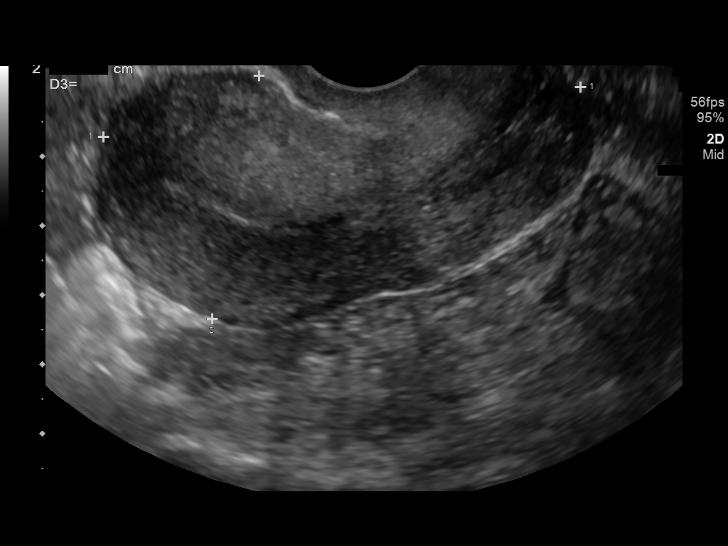
[im 21/41]
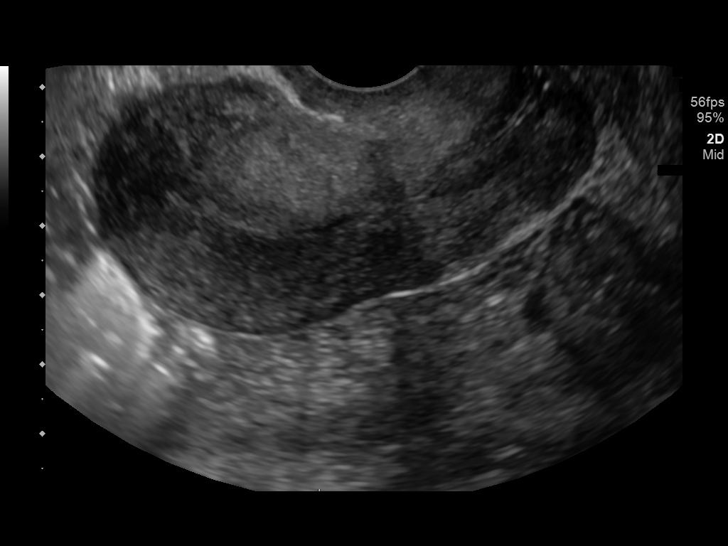
[im 24/41]
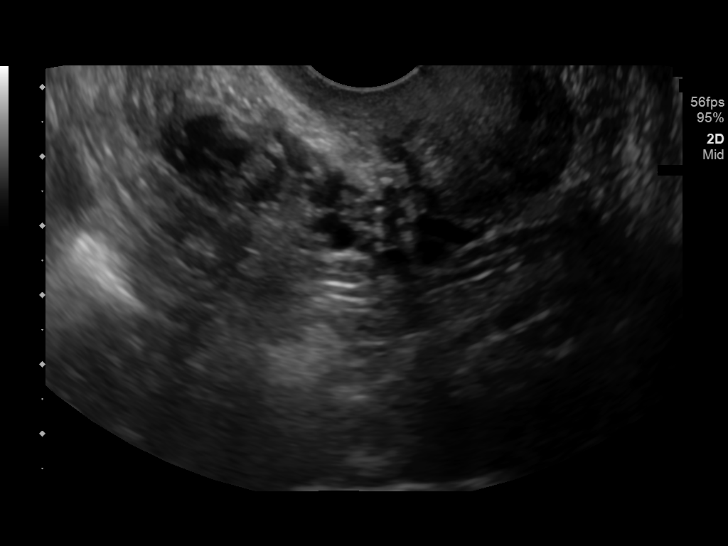
[im 27/41]
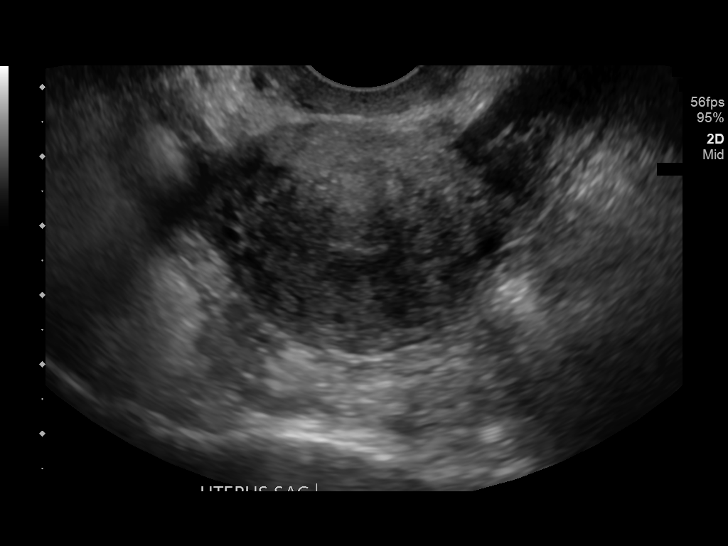
[im 31/41]
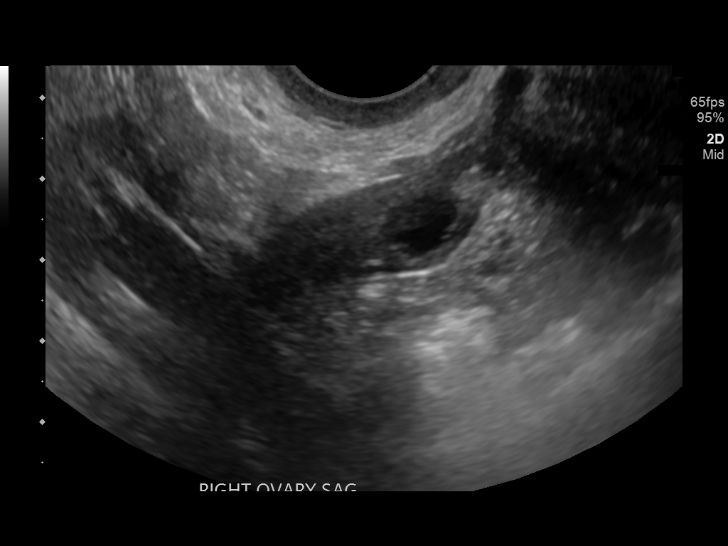
[im 34/41]
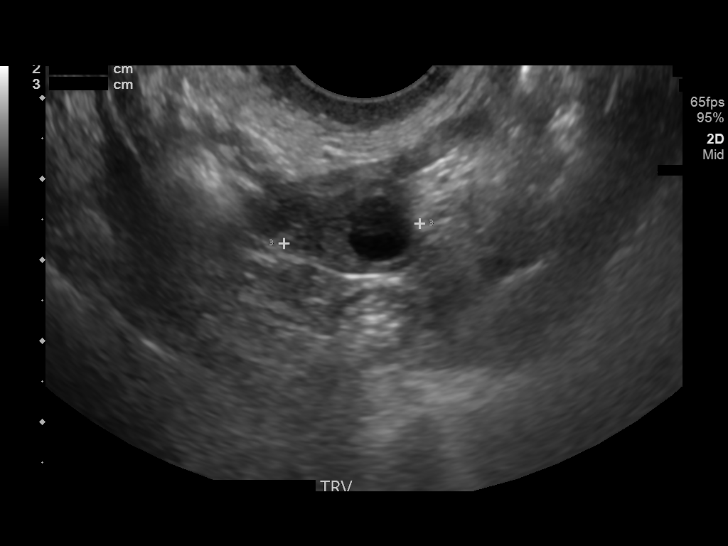
[im 37/41]
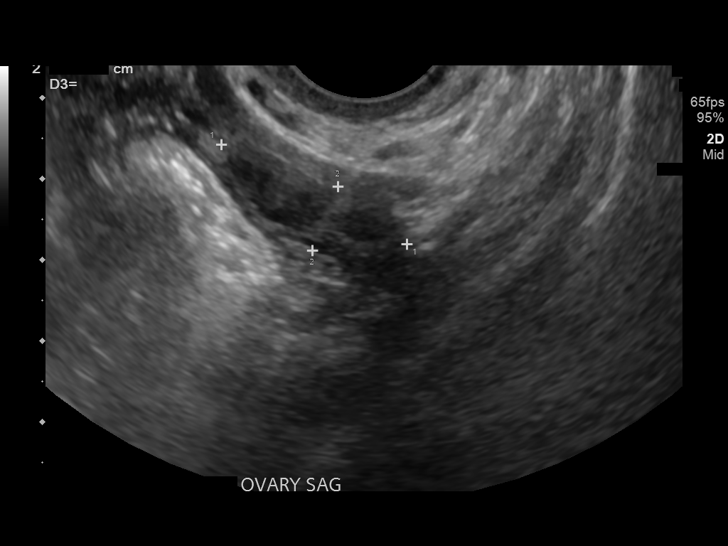
[im 41/41]
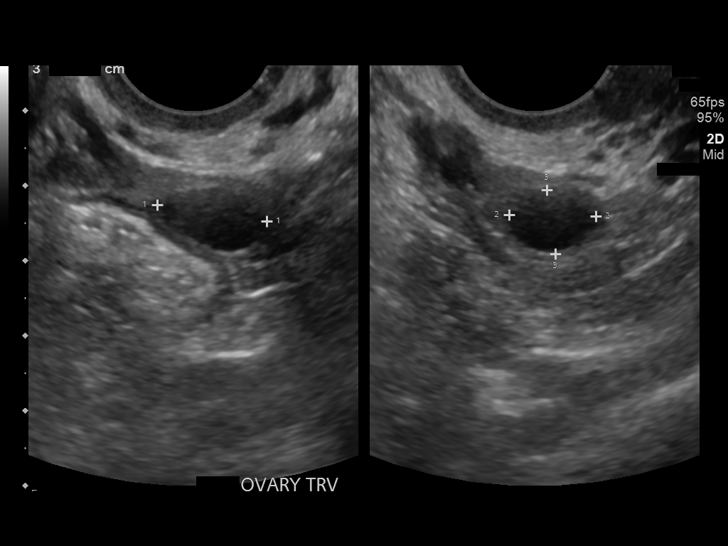

[13 of 25 positions shown; findings below may reference images not displayed]

FINDINGS: Uterus

Measurements: 6.9 x 3.6 x 4.0 cm = volume: 51.7 mL. No fibroids or
other mass visualized.

Endometrium

Thickness: 3 mm, normal.  No focal abnormality visualized.

Right ovary

Measurements: 3.1 x 1.1 x 1.7 cm = volume: 2.95 mL. There is a
cm follicular cyst in the right ovary. Blood flow is noted. No
adnexal mass.

Left ovary

Measurements: 2.6 x 0.9 x 1.8 cm = volume: 2.0 mL. There is a 1.5 cm
cyst in the left ovary. Blood flow is noted. No adnexal mass.

Other findings

No abnormal free fluid.
IMPRESSION: 1. Normal sonographic appearance of both ovaries with physiologic
follicular cysts, largest measuring 1.5 cm. No dedicated imaging
follow-up is needed.
2. Normal sonographic appearance of the uterus and endometrium.

## 2019-04-06 IMAGING — US US RENAL
1 series · 14 of 25 positions shown · non-contrast
Comparison: None.

CLINICAL DATA: Right-sided flank pain, recurrent UTI

EXAM:
RENAL / URINARY TRACT ULTRASOUND COMPLETE

[Series 1: us renal · 14 of 35 slices shown]
[im 1/35]
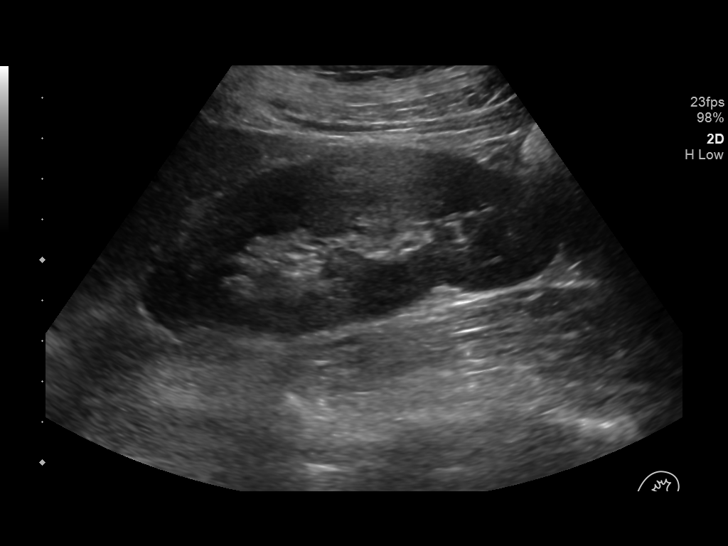
[im 3/35]
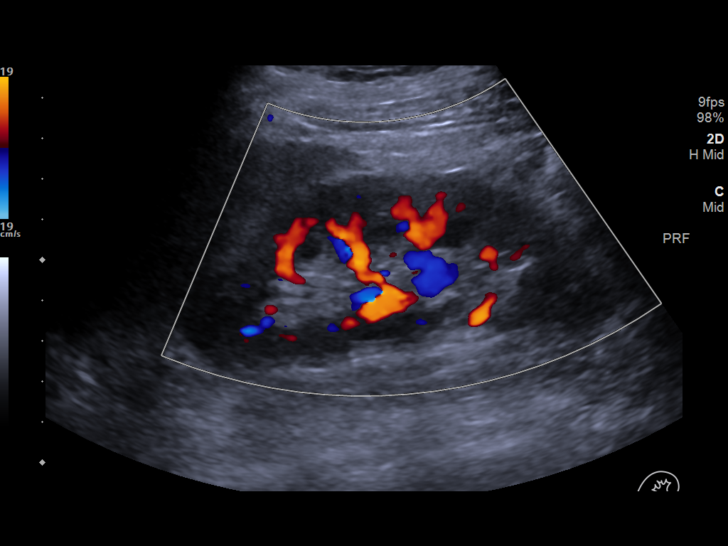
[im 6/35]
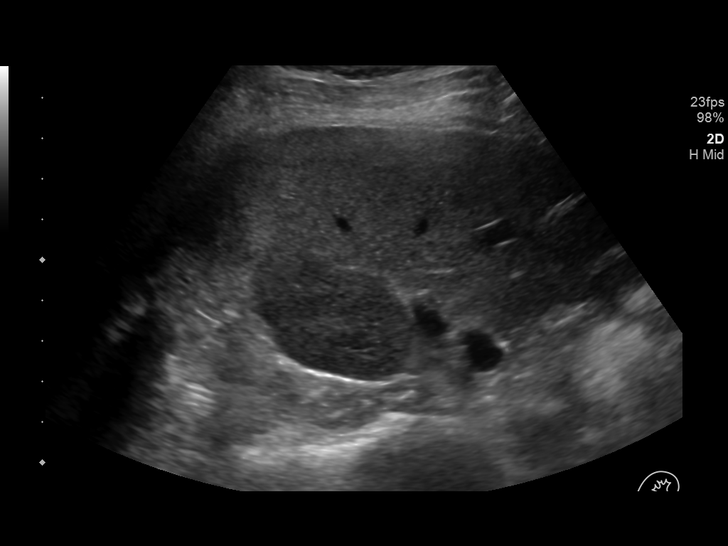
[im 9/35]
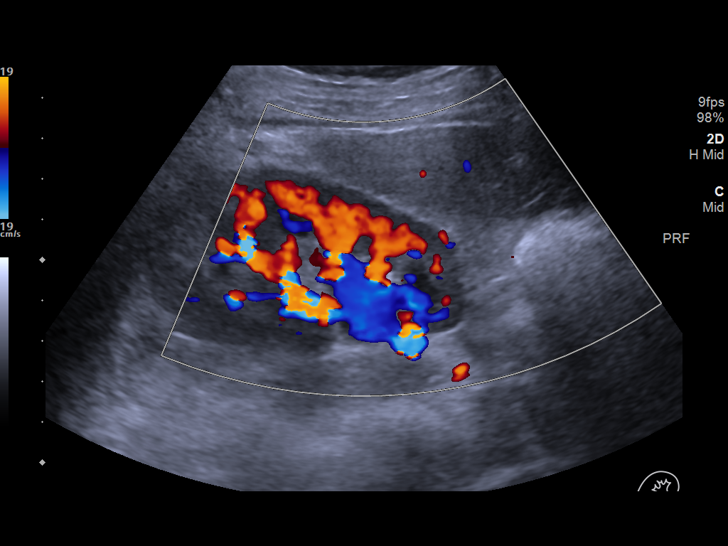
[im 12/35]
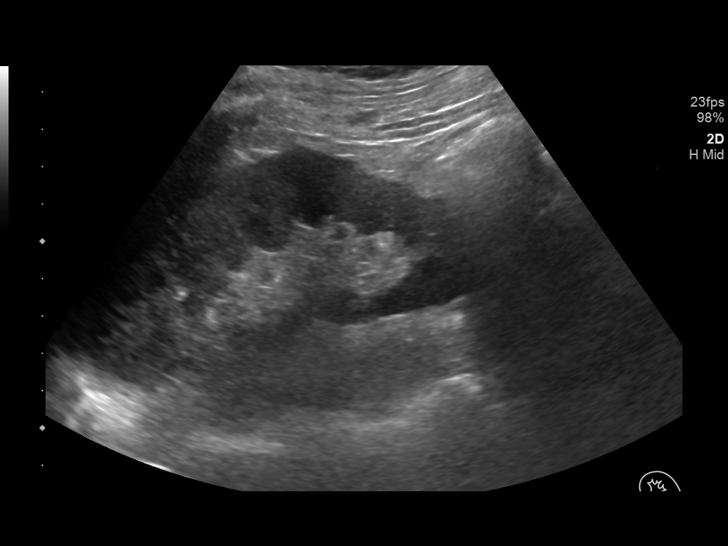
[im 13/35]
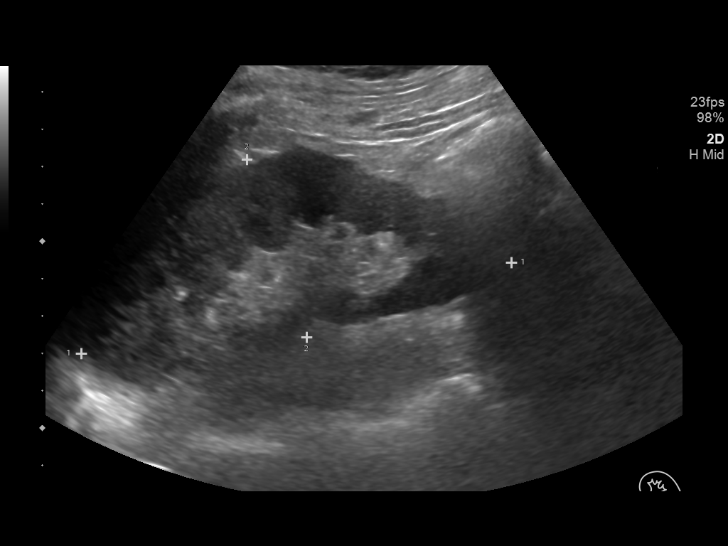
[im 16/35]
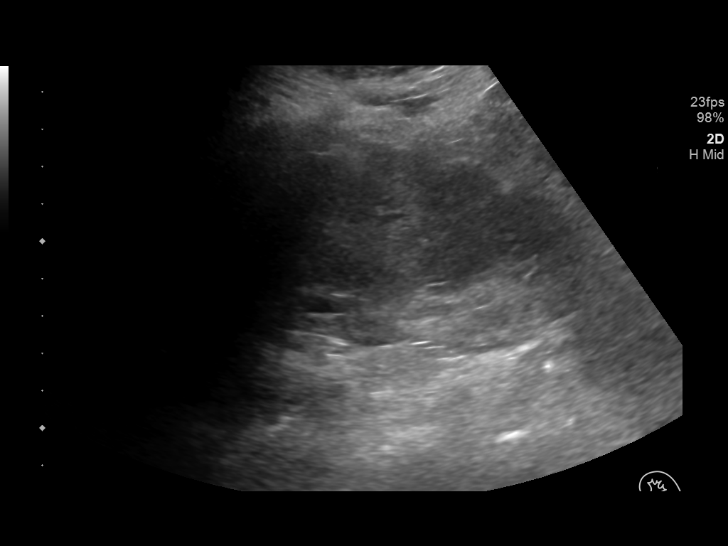
[im 19/35]
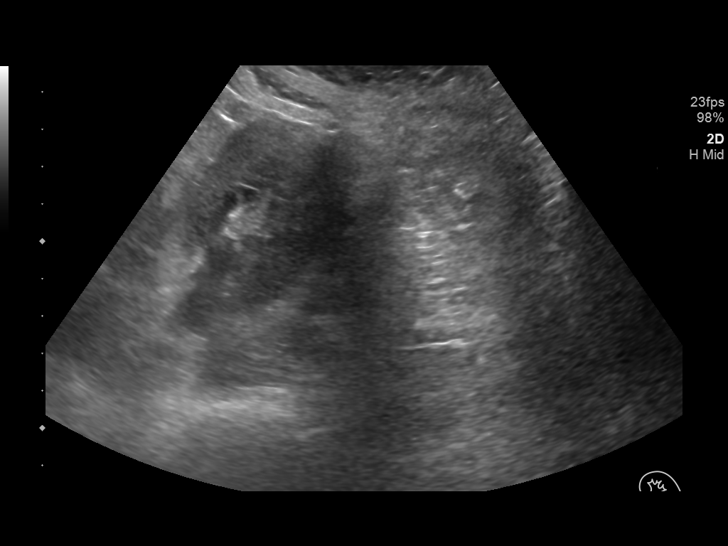
[im 22/35]
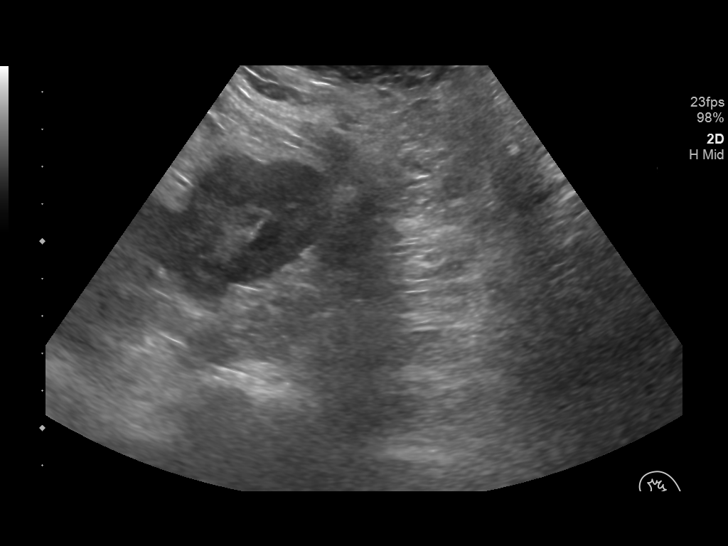
[im 23/35]
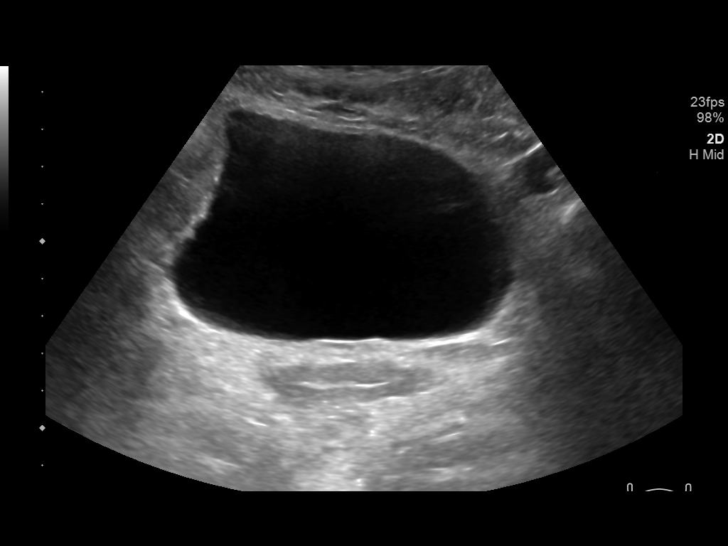
[im 26/35]
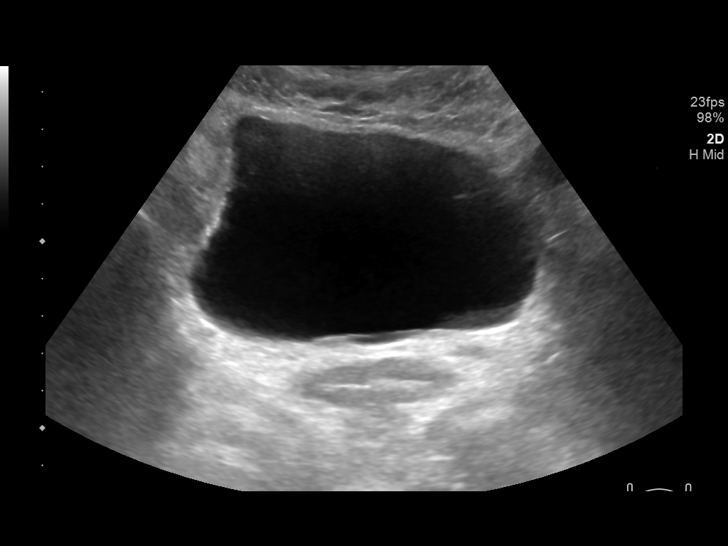
[im 29/35]
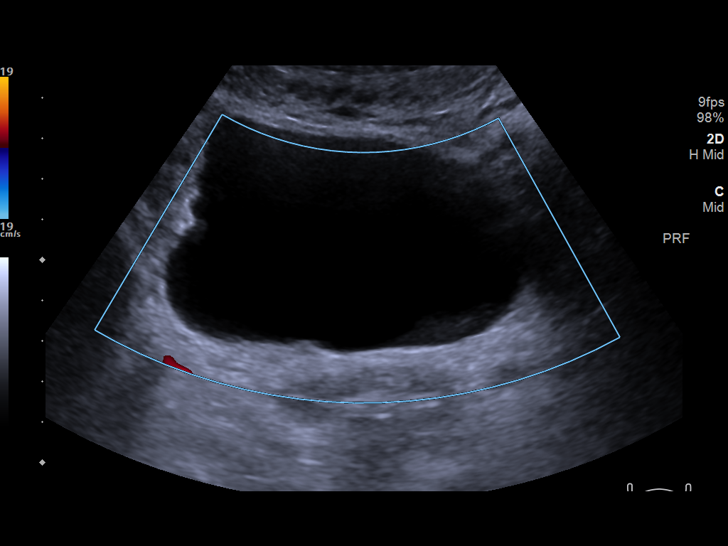
[im 32/35]
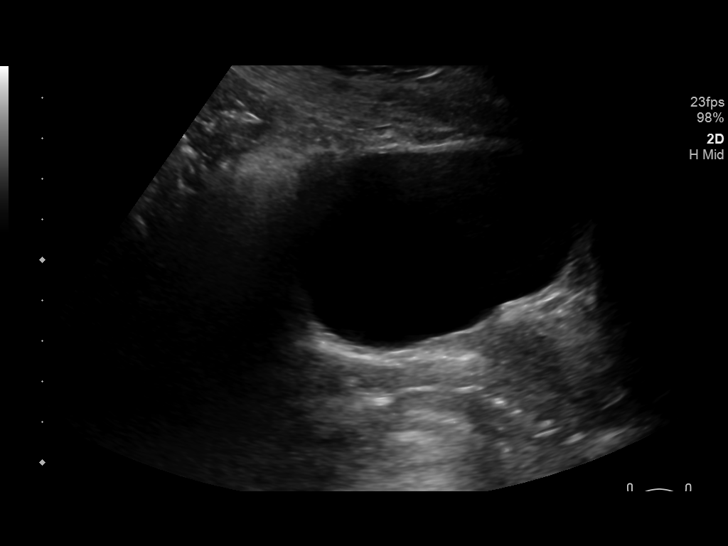
[im 35/35]
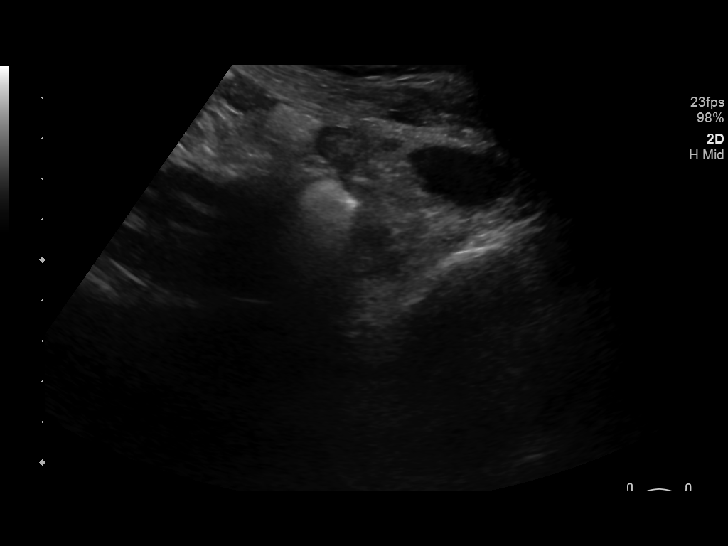

[14 of 25 positions shown; findings below may reference images not displayed]

FINDINGS: Right Kidney:

Renal measurements: 10.4 x 4.4 x 5.6 cm = volume: 133 mL .
Echogenicity within normal limits. No mass or hydronephrosis
visualized.

Left Kidney:

Renal measurements: 11.8 x 5 x 5 cm = volume: 155.3 mL. Echogenicity
within normal limits. No mass or hydronephrosis visualized.

Bladder:

Small amount of debris in the bladder.
IMPRESSION: 1. Negative ultrasound appearance of the kidneys
2. Small amount of debris in the bladder. Bladder is otherwise
normal in appearance.

## 2019-12-17 ENCOUNTER — Telehealth: Payer: Self-pay | Admitting: Hematology and Oncology

## 2019-12-17 NOTE — Telephone Encounter (Signed)
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 Ms Destiny Travis has been scheduled to see Dr. Leonides Schanz on 12/9 at 1pm. Pt aware to arrive 30 minutes early.

## 2019-12-26 ENCOUNTER — Encounter: Payer: Self-pay | Admitting: Hematology and Oncology

## 2019-12-26 ENCOUNTER — Other Ambulatory Visit: Payer: Self-pay

## 2019-12-26 ENCOUNTER — Inpatient Hospital Stay: Payer: BC Managed Care – PPO | Attending: Hematology and Oncology | Admitting: Hematology and Oncology

## 2019-12-26 ENCOUNTER — Inpatient Hospital Stay: Payer: BC Managed Care – PPO

## 2019-12-26 VITALS — BP 173/101 | HR 91 | Temp 98.3°F | Resp 13 | Wt 124.7 lb

## 2019-12-26 DIAGNOSIS — Z862 Personal history of diseases of the blood and blood-forming organs and certain disorders involving the immune mechanism: Secondary | ICD-10-CM | POA: Diagnosis not present

## 2019-12-26 DIAGNOSIS — D751 Secondary polycythemia: Secondary | ICD-10-CM

## 2019-12-26 DIAGNOSIS — E559 Vitamin D deficiency, unspecified: Secondary | ICD-10-CM | POA: Insufficient documentation

## 2019-12-26 DIAGNOSIS — F17200 Nicotine dependence, unspecified, uncomplicated: Secondary | ICD-10-CM

## 2019-12-26 DIAGNOSIS — F17210 Nicotine dependence, cigarettes, uncomplicated: Secondary | ICD-10-CM | POA: Insufficient documentation

## 2019-12-26 DIAGNOSIS — I10 Essential (primary) hypertension: Secondary | ICD-10-CM | POA: Insufficient documentation

## 2019-12-26 DIAGNOSIS — R232 Flushing: Secondary | ICD-10-CM | POA: Diagnosis not present

## 2019-12-26 DIAGNOSIS — D7589 Other specified diseases of blood and blood-forming organs: Secondary | ICD-10-CM

## 2019-12-26 DIAGNOSIS — Z791 Long term (current) use of non-steroidal anti-inflammatories (NSAID): Secondary | ICD-10-CM | POA: Diagnosis not present

## 2019-12-26 DIAGNOSIS — Z7289 Other problems related to lifestyle: Secondary | ICD-10-CM | POA: Insufficient documentation

## 2019-12-26 DIAGNOSIS — R7989 Other specified abnormal findings of blood chemistry: Secondary | ICD-10-CM | POA: Insufficient documentation

## 2019-12-26 DIAGNOSIS — Z8249 Family history of ischemic heart disease and other diseases of the circulatory system: Secondary | ICD-10-CM | POA: Diagnosis not present

## 2019-12-26 LAB — CBC WITH DIFFERENTIAL (CANCER CENTER ONLY)
Abs Immature Granulocytes: 0.02 10*3/uL (ref 0.00–0.07)
Basophils Absolute: 0.1 10*3/uL (ref 0.0–0.1)
Basophils Relative: 1 %
Eosinophils Absolute: 0.3 10*3/uL (ref 0.0–0.5)
Eosinophils Relative: 5 %
HCT: 45.3 % (ref 36.0–46.0)
Hemoglobin: 16 g/dL — ABNORMAL HIGH (ref 12.0–15.0)
Immature Granulocytes: 0 %
Lymphocytes Relative: 38 %
Lymphs Abs: 2.8 10*3/uL (ref 0.7–4.0)
MCH: 34.6 pg — ABNORMAL HIGH (ref 26.0–34.0)
MCHC: 35.3 g/dL (ref 30.0–36.0)
MCV: 97.8 fL (ref 80.0–100.0)
Monocytes Absolute: 0.6 10*3/uL (ref 0.1–1.0)
Monocytes Relative: 8 %
Neutro Abs: 3.5 10*3/uL (ref 1.7–7.7)
Neutrophils Relative %: 48 %
Platelet Count: 255 10*3/uL (ref 150–400)
RBC: 4.63 MIL/uL (ref 3.87–5.11)
RDW: 11.9 % (ref 11.5–15.5)
WBC Count: 7.2 10*3/uL (ref 4.0–10.5)
nRBC: 0 % (ref 0.0–0.2)

## 2019-12-26 LAB — CMP (CANCER CENTER ONLY)
ALT: 29 U/L (ref 0–44)
AST: 34 U/L (ref 15–41)
Albumin: 4.2 g/dL (ref 3.5–5.0)
Alkaline Phosphatase: 78 U/L (ref 38–126)
Anion gap: 10 (ref 5–15)
BUN: 9 mg/dL (ref 6–20)
CO2: 26 mmol/L (ref 22–32)
Calcium: 9.8 mg/dL (ref 8.9–10.3)
Chloride: 104 mmol/L (ref 98–111)
Creatinine: 0.77 mg/dL (ref 0.44–1.00)
GFR, Estimated: >60 mL/min (ref >60)
Glucose, Bld: 89 mg/dL (ref 70–99)
Potassium: 3.8 mmol/L (ref 3.5–5.1)
Sodium: 140 mmol/L (ref 135–145)
Total Bilirubin: 0.7 mg/dL (ref 0.3–1.2)
Total Protein: 7.4 g/dL (ref 6.5–8.1)

## 2019-12-26 LAB — IRON AND TIBC
Iron: 162 ug/dL — ABNORMAL HIGH (ref 41–142)
Saturation Ratios: 52 % (ref 21–57)
TIBC: 310 ug/dL (ref 236–444)
UIBC: 148 ug/dL (ref 120–384)

## 2019-12-26 LAB — VITAMIN B12: Vitamin B-12: 263 pg/mL (ref 180–914)

## 2019-12-26 LAB — LACTATE DEHYDROGENASE: LDH: 217 U/L — ABNORMAL HIGH (ref 98–192)

## 2019-12-26 LAB — FERRITIN: Ferritin: 161 ng/mL (ref 11–307)

## 2019-12-26 LAB — FOLATE: Folate: 15.7 ng/mL (ref >5.9)

## 2019-12-26 LAB — SAVE SMEAR(SSMR), FOR PROVIDER SLIDE REVIEW

## 2019-12-26 NOTE — Progress Notes (Addendum)
[place] [phone]   [phone]  INITIAL CONSULT NOTE  Patient Care Team: Patient, No Pcp Per as PCP - General (General Practice)  Hematological/Oncological History # Polycythemia # Macrocytosis 12/03/2019: WBC 7.5, Hgb 17.1, MCV 104, Plt 227 12/26/2019: establish care with Dr. Lorenso Courier   CHIEF COMPLAINTS/PURPOSE OF CONSULTATION:  "Polycythemia "  HISTORY OF PRESENTING ILLNESS:  Destiny Travis 49 y.o. female with medical history significant for vitamin D deficiency, smoking, and hypertension who presents for evaluation of polycythemia.   On review of the previous records Destiny Travis was referred by Dr. Kristie Cowman at [facility]. She was last seen on 12/03/2019 at which time she was noted to have WBC 7.5, Hgb 17.1, MCV 104, Plt 227. Due to concern for polycythemia she was referred to hematology for further evaluation and management.   On exam today Destiny Travis is accompanied by her husband. She notes that she is had numerous health issues develop in the last 6 months time. She has been diagnosed with hypertension and has been described antihypertensives but has not yet started that therapy yet. She reports that she does have some occasional skin rashing and itching but it is not associated with a particular time of day or activity. She notes that she is an active smoker smoking approximately 1 pack/day and drinks alcohol (previously up to 1 pint a day)reportedly 5-7 shots daily at this time.  Further discussion she notes that she did have issues with anemia when she was younger and took iron pills. She notes that she has subsequently gone through menopause and occasionally has hot flashes so severe that they had to put towels on her. She notes that the infrequency of these hot flashes has increased in the last 6 months. She notes her family history is remarkable for heart disease in her father and COPD in her mother. Her brother and  sister also suffer from hypertension. She reports that she eats unrestricted diet and enjoys steak, fish, and fruits and veggies but does not eat any chicken. She reportedly drinks 1 gallon of orange juice daily. She otherwise denies any frank fevers, chills, sweats, nausea, vomiting or diarrhea. A full 10 point ROS is listed below.  MEDICAL HISTORY:  Past Medical History:  Diagnosis Date  . Anemia   . Hypertension     SURGICAL HISTORY: History reviewed. No pertinent surgical history.  SOCIAL HISTORY: Social History   Socioeconomic History  . Marital status: Married    Spouse name: Not on file  . Number of children: Not on file  . Years of education: Not on file  . Highest education level: Not on file  Occupational History  . Not on file  Tobacco Use  . Smoking status: Current Every Day Smoker    Packs/day: 1.00    Types: Cigarettes  . Smokeless tobacco: Current User  Substance and Sexual Activity  . Alcohol use: Yes    Alcohol/week: 5.0 standard drinks    Types: 5 Shots of liquor per week  . Drug use: Yes    Types: Marijuana  . Sexual activity: Not on file  Other Topics Concern  . Not on file  Social History Narrative  . Not on file   Social Determinants of Health   Financial Resource Strain: Not on file  Food Insecurity: Not on file  Transportation Needs: Not on file  Physical Activity: Not on file  Stress: Not on file  Social Connections: Not on file  Intimate Partner Violence:  Not on file    FAMILY HISTORY: Family History  Problem Relation Age of Onset  . COPD Mother   . Hypertension Father   . Heart disease Father   . Parkinson's disease Father   . Hypertension Sister   . Hypertension Brother     ALLERGIES:  is allergic to penicillins and erythromycin.  MEDICATIONS:  Current Outpatient Medications  Medication Sig Dispense Refill  . Cholecalciferol (VITAMIN D3) 25 MCG (1000 UT) CAPS Take 1 capsule by mouth daily.    . clindamycin (CLEOCIN T) 1 %  lotion     . diclofenac (VOLTAREN) 75 MG EC tablet Take 75 mg by mouth 2 (two) times daily as needed for mild pain or moderate pain.    . ergocalciferol (VITAMIN D2) 1.25 MG (50000 UT) capsule Vitamin D2 1,250 mcg (50,000 unit) capsule    . etonogestrel (NEXPLANON) 68 MG IMPL implant Nexplanon 68 mg subdermal implant  Inject 1 implant by subcutaneous route as directed.    . fluconazole (DIFLUCAN) 150 MG tablet fluconazole 150 mg tablet    . lidocaine (LIDODERM) 5 % Place 1 patch onto the skin daily. Remove & Discard patch within 12 hours or as directed by MD 10 patch 0  . methocarbamol (ROBAXIN) 500 MG tablet Take 500-1,000 mg by mouth every 6 (six) hours as needed for muscle spasms.      No current facility-administered medications for this visit.    REVIEW OF SYSTEMS:   Constitutional: ( - ) fevers, ( - )  chills , ( - ) night sweats Eyes: ( - ) blurriness of vision, ( - ) double vision, ( - ) watery eyes Ears, nose, mouth, throat, and face: ( - ) mucositis, ( - ) sore throat Respiratory: ( - ) cough, ( - ) dyspnea, ( - ) wheezes Cardiovascular: ( - ) palpitation, ( - ) chest discomfort, ( - ) lower extremity swelling Gastrointestinal:  ( - ) nausea, ( - ) heartburn, ( - ) change in bowel habits Skin: ( - ) abnormal skin rashes Lymphatics: ( - ) new lymphadenopathy, ( - ) easy bruising Neurological: ( - ) numbness, ( - ) tingling, ( - ) new weaknesses Behavioral/Psych: ( - ) mood change, ( - ) new changes  All other systems were reviewed with the patient and are negative.  PHYSICAL EXAMINATION: ECOG PERFORMANCE STATUS: 0 - Asymptomatic  Vitals:   12/26/19 1319  BP: (!) 173/101  Pulse: 91  Resp: 13  Temp: 98.3 F (36.8 C)  SpO2: 100%   Filed Weights   12/26/19 1319  Weight: 124 lb 11.2 oz (56.6 kg)    GENERAL: well appearing middle aged Caucasian female in NAD  SKIN: skin color, texture, turgor are normal, no rashes or significant lesions EYES: conjunctiva are pink and  non-injected, sclera clear LUNGS: clear to auscultation and percussion with normal breathing effort HEART: regular rate & rhythm and no murmurs and no lower extremity edema Musculoskeletal: no cyanosis of digits and no clubbing  PSYCH: alert & oriented x 3, fluent speech NEURO: no focal motor/sensory deficits  LABORATORY DATA:  I have reviewed the data as listed CBC Latest Ref Rng & Units 12/26/2019 08/20/2006 08/19/2006  WBC 4.0 - 10.5 K/uL 7.2 11.1(H) 13.2(H)  Hemoglobin 12.0 - 15.0 g/dL 16.0(H) 12.2 12.9  Hematocrit 36.0 - 46.0 % 45.3 36.0 37.5  Platelets 150 - 400 K/uL 255 194 217    CMP Latest Ref Rng & Units 12/26/2019 08/20/2006 08/19/2006  Glucose  70 - 99 mg/dL 89 91 114(H)  BUN 6 - 20 mg/dL 9 4(L) 4(L)  Creatinine 0.44 - 1.00 mg/dL 0.77 0.56 0.49  Sodium 135 - 145 mmol/L 140 139 140  Potassium 3.5 - 5.1 mmol/L 3.8 3.5 3.3(L)  Chloride 98 - 111 mmol/L 104 107 110  CO2 22 - 32 mmol/L '26 26 24  ' Calcium 8.9 - 10.3 mg/dL 9.8 8.8 8.8  Total Protein 6.5 - 8.1 g/dL 7.4 - 5.3(L)  Total Bilirubin 0.3 - 1.2 mg/dL 0.7 - 0.9  Alkaline Phos 38 - 126 U/L 78 - 65  AST 15 - 41 U/L 34 - 15  ALT 0 - 44 U/L 29 - 12    RADIOGRAPHIC STUDIES: No results found.  ASSESSMENT & PLAN Destiny Travis 49 y.o. female with medical history significant for vitamin D deficiency, smoking, and hypertension who presents for evaluation of polycythemia. 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 The patient has a hemoglobin of 17.1 on last check but does smoke approximately 1 pack/day of cigarettes. Her husband does endorse that she snores and sleep apnea could be a consideration as well if smoking cessation is not effective. In order to assure that this is indeed secondary polycythemia I would recommend ordering a JAK2 panel with reflex as well as BCR/ ABL FISH.  In terms of the patient's  macrocytosis the most likely etiology is her smoking. She was noted to have an MCV of 104 on her last visit without any anemia. Given this finding I would recommend alcohol cessation but we will order nutritional studies in order to assure that nutritional deficiency is not a driving cause. If this were to worsen or be associate with other cytopenias we would need to consider bone marrow biopsy.   # Polycythemia, likely Secondary -- findings are consistent with a polycythemia, likely as a result of smoking. Encourage smoking cessation --to assure this is not primary polycythemia will order iron studies, erythropoietin, JAK2 panel w/ reflex MPN and BCR/ABL FISH. --no clear indication for a bone marrow biopsy at this time.  -- RTC in 3 months time or sooner if concerning abnormality is noted on the above labs.   #Macrocytosis without Anemia --MCV noted to be 104 at last PCP visit --will order copper, folate, vitamin B12, MMA, and homocyteine --patient also noted to have mild elevation in LFTs at that time, likely related to EtOH consumption or possible underlying liver disorder -- encourage cessation of EtOH.  --if persistent or associated with other cytopenias consider bone marrow biopsy.   Orders Placed This Encounter  Procedures  . CBC with Differential (Cancer Center Only)    Standing Status:   Future    Number of Occurrences:   1    Standing Expiration Date:   12/25/2020  . CMP ([place] only)    Standing Status:   Future    Number of Occurrences:   1    Standing Expiration Date:   12/25/2020  . Lactate dehydrogenase (LDH)    Standing Status:   Future    Number of Occurrences:   1    Standing Expiration Date:   12/25/2020  . Iron and TIBC    Standing Status:   Future    Number of Occurrences:   1    Standing Expiration Date:   12/25/2020  . Ferritin    Standing Status:   Future  Number of Occurrences:   1    Standing Expiration Date:   12/25/2020  . JAK2 (INCLUDING V617F  AND EXON 12), MPL,& CALR W/RFL MPN PANEL (NGS)    Standing Status:   Future    Number of Occurrences:   1    Standing Expiration Date:   12/25/2020  . BCR ABL1 FISH ([facility])    Standing Status:   Future    Number of Occurrences:   1    Standing Expiration Date:   12/25/2020  . Folate, Serum    Standing Status:   Future    Number of Occurrences:   1    Standing Expiration Date:   12/25/2020  . Vitamin B12    Standing Status:   Future    Number of Occurrences:   1    Standing Expiration Date:   12/25/2020  . Methylmalonic acid, serum    Standing Status:   Future    Number of Occurrences:   1    Standing Expiration Date:   12/25/2020  . Copper, serum    Standing Status:   Future    Number of Occurrences:   1    Standing Expiration Date:   12/25/2020  . Homocysteine, serum    Standing Status:   Future    Number of Occurrences:   1    Standing Expiration Date:   12/25/2020  . Erythropoietin    Standing Status:   Future    Number of Occurrences:   1    Standing Expiration Date:   12/25/2020  . Save Smear (SSMR)    Standing Status:   Future    Number of Occurrences:   1    Standing Expiration Date:   12/25/2020    All questions were answered. The patient knows to call the clinic with any problems, questions or concerns.  A total of more than 60 minutes were spent on this encounter and over half of that time was spent on counseling and coordination of care as outlined above.   Ledell Peoples, MD Department of Hematology/Oncology [place] [facility] Phone: [phone] Pager: [phone] Email: [email or web address]  12/26/2019 4:05 PM

## 2019-12-27 LAB — HOMOCYSTEINE: Homocysteine: 13.7 umol/L (ref 0.0–14.5)

## 2019-12-27 LAB — ERYTHROPOIETIN: Erythropoietin: 6.8 m[IU]/mL (ref 2.6–18.5)

## 2019-12-28 LAB — COPPER, SERUM: Copper: 124 ug/dL (ref 80–158)

## 2020-01-01 ENCOUNTER — Telehealth: Payer: Self-pay | Admitting: *Deleted

## 2020-01-01 LAB — METHYLMALONIC ACID, SERUM: Methylmalonic Acid, Quantitative: 242 nmol/L (ref 0–378)

## 2020-01-01 NOTE — Telephone Encounter (Signed)
Received vm message from patient requesting lab results. Advised that all the results are not available yet and that Dr. Leonides Schanz prefers to have all results in before he  Calls.  Attempted call back. No answer but was able to leave vm message as above.

## 2020-01-14 LAB — BCR ABL1 FISH (GENPATH)

## 2020-01-14 LAB — JAK2 (INCLUDING V617F AND EXON 12), MPL,& CALR W/RFL MPN PANEL (NGS)

## 2020-01-23 ENCOUNTER — Telehealth: Payer: Self-pay | Admitting: *Deleted

## 2020-01-23 NOTE — Telephone Encounter (Signed)
TCT patient regarding recent lab work. Spoke with patient and advised that her lab results did not show any genetic mutations that would cause her HGB to be elevated.  Dr. Leonides Schanz believes the most likely cause would be her smoking and advises that she stop smoking. Reviewed likely cause of change in her MCV-relayed to previous alcohol use. The plan is to see her in 3 months and re-check her labs.  Pt voiced understanding to the above.

## 2020-01-23 NOTE — Telephone Encounter (Signed)
-----   Message from Jaci Standard, MD sent at 01/23/2020  8:53 AM EST ----- Please let Destiny Travis know that her bloodwork showed no evidence of a genetic mutation causing her elevated Hgb.The most likely cause of her high Hgb is low oxygen levels due to smoking. Her MCV (RBC size) also returned to normal, implying they may have been enlarged due to alcohol use. We will plan to see her back in about 3 months for continued monitoring.   ----- Message ----- From: Interface, Lab In [place] Sent: 12/26/2019   2:39 PM EST To: Jaci Standard, MD

## 2020-03-24 ENCOUNTER — Other Ambulatory Visit: Payer: Self-pay | Admitting: Hematology and Oncology

## 2020-03-24 DIAGNOSIS — D751 Secondary polycythemia: Secondary | ICD-10-CM

## 2020-03-24 NOTE — Progress Notes (Signed)
No show

## 2020-03-25 ENCOUNTER — Inpatient Hospital Stay: Payer: BC Managed Care – PPO | Attending: Hematology and Oncology | Admitting: Hematology and Oncology

## 2020-03-25 ENCOUNTER — Inpatient Hospital Stay: Payer: BC Managed Care – PPO

## 2020-03-25 DIAGNOSIS — D7589 Other specified diseases of blood and blood-forming organs: Secondary | ICD-10-CM

## 2020-03-25 DIAGNOSIS — D751 Secondary polycythemia: Secondary | ICD-10-CM

## 2020-03-25 DIAGNOSIS — F17200 Nicotine dependence, unspecified, uncomplicated: Secondary | ICD-10-CM

## 2021-07-06 ENCOUNTER — Other Ambulatory Visit (HOSPITAL_COMMUNITY)
Admission: RE | Admit: 2021-07-06 | Discharge: 2021-07-06 | Disposition: A | Discharge status: Home / Self Care | Payer: BC Managed Care – PPO | Source: Ambulatory Visit | Attending: Obstetrics and Gynecology | Admitting: Obstetrics and Gynecology

## 2021-07-06 ENCOUNTER — Ambulatory Visit (INDEPENDENT_AMBULATORY_CARE_PROVIDER_SITE_OTHER): Payer: BC Managed Care – PPO | Admitting: Obstetrics and Gynecology

## 2021-07-06 ENCOUNTER — Encounter: Payer: Self-pay | Admitting: Obstetrics and Gynecology

## 2021-07-06 VITALS — BP 130/80 | HR 67 | Ht 62.0 in | Wt 130.0 lb

## 2021-07-06 DIAGNOSIS — R232 Flushing: Secondary | ICD-10-CM

## 2021-07-06 DIAGNOSIS — Z1211 Encounter for screening for malignant neoplasm of colon: Secondary | ICD-10-CM | POA: Diagnosis not present

## 2021-07-06 DIAGNOSIS — R194 Change in bowel habit: Secondary | ICD-10-CM | POA: Insufficient documentation

## 2021-07-06 DIAGNOSIS — Z7189 Other specified counseling: Secondary | ICD-10-CM | POA: Insufficient documentation

## 2021-07-06 DIAGNOSIS — R634 Abnormal weight loss: Secondary | ICD-10-CM | POA: Insufficient documentation

## 2021-07-06 DIAGNOSIS — Z124 Encounter for screening for malignant neoplasm of cervix: Secondary | ICD-10-CM | POA: Insufficient documentation

## 2021-07-06 DIAGNOSIS — R61 Generalized hyperhidrosis: Secondary | ICD-10-CM

## 2021-07-06 DIAGNOSIS — Z01419 Encounter for gynecological examination (general) (routine) without abnormal findings: Secondary | ICD-10-CM

## 2021-07-06 DIAGNOSIS — F4389 Other reactions to severe stress: Secondary | ICD-10-CM

## 2021-07-06 DIAGNOSIS — R140 Abdominal distension (gaseous): Secondary | ICD-10-CM | POA: Insufficient documentation

## 2021-07-06 DIAGNOSIS — Z3046 Encounter for surveillance of implantable subdermal contraceptive: Secondary | ICD-10-CM

## 2021-07-06 DIAGNOSIS — R1010 Upper abdominal pain, unspecified: Secondary | ICD-10-CM | POA: Insufficient documentation

## 2021-07-06 DIAGNOSIS — K625 Hemorrhage of anus and rectum: Secondary | ICD-10-CM | POA: Insufficient documentation

## 2021-07-06 NOTE — Patient Instructions (Signed)

## 2021-07-06 NOTE — Progress Notes (Addendum)
51 y.o. Destiny Travis Married White or Caucasian Not Hispanic or Latino female here for annual exam. She is just wanting to establish care. Her GYN has retired. She has a Mammo scheduled at [facility].  No cycles with Nexplanon, overdue for removal by a few years. No vaginal bleeding. She is having hot flashes, night sweats and vaginal dryness. If not menopausal wants another nexplanon. No dyspareunia.    Recent lab work showed elevated LFT and elevated creatinine. She had a RUQ ultrasound last week. She is going to see a specialist.   She used to have GSI, recently better. Doesn't void a lot. Feels she is emptying.   She is under chronic stress. 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 Destiny Travis can be violent.  Destiny Travis drinks to help her cope. Working on cutting back.   No LMP recorded. Patient has had an implant.          Sexually active: Yes.    The current method of family planning is Implant .    Exercising: No.  The patient does not participate in regular exercise at present. Smoker:  yes, 1PPD, trying to quit.   Health Maintenance: Pap:  before 2020  History of abnormal Pap:  yes had a colpo 20 years ago, no h/o surgery.   MMG:  was before covid she is getting one at [facility]  BMD:   none  Colonoscopy: 10 years ago normal  TDaP:  2 years ago  Gardasil: N/A   reports that she has been smoking cigarettes. She has been smoking an average of 1 pack per day. She has been exposed to tobacco smoke. She has never used smokeless tobacco. She reports current alcohol use of about 5.0 standard drinks of alcohol per week. She reports current drug use. Frequency: 7.00 times per week. Drugs: Marijuana and Cocaine. Drinks a pint of vodka a day, this is less than it was, she is cutting back. She is a full time caretaker of her adult daughter, Destiny Travis, who can be violent. She is 2 grown sons. 4 year old granddaughter. Married for 27 years, husband is supportive.   Past Medical  History:  Diagnosis Date   Anemia    Hypertension    STD (sexually transmitted disease)    Substance abuse (HCC)     Past Surgical History:  Procedure Laterality Date   KNEE ARTHROSCOPY     patient was 10    Current Outpatient Medications  Medication Sig Dispense Refill   Cholecalciferol (VITAMIN D3) 25 MCG (1000 UT) CAPS Take 1 capsule by mouth daily.     ergocalciferol (VITAMIN D2) 1.25 MG (50000 UT) capsule Vitamin D2 1,250 mcg (50,000 unit) capsule     etonogestrel (NEXPLANON) 68 MG IMPL implant Nexplanon 68 mg subdermal implant  Inject 1 implant by subcutaneous route as directed.     fluconazole (DIFLUCAN) 150 MG tablet fluconazole 150 mg tablet     methocarbamol (ROBAXIN) 500 MG tablet Take 500-1,000 mg by mouth every 6 (six) hours as needed for muscle spasms.      clindamycin (CLEOCIN T) 1 % lotion  (Patient not taking: Reported on 07/06/2021)     No current facility-administered medications for this visit.    Family History  Problem Relation Age of Onset   COPD Mother    Hypertension Father    Heart disease Father    Parkinson's disease Father    Breast cancer Sister 41   Hypertension Sister  Hypertension Brother    Breast cancer Maternal Aunt 66   Breast cancer Half-Sister 47    Review of Systems  All other systems reviewed and are negative.   Exam:   BP 130/80   Pulse 67   Ht 5\' 2"  (1.575 m)   Wt 130 lb (59 kg)   SpO2 99%   BMI 23.78 kg/m   Weight change: @WEIGHTCHANGE @ Height:   Height: 5\' 2"  (157.5 cm)  Ht Readings from Last 3 Encounters:  07/06/21 5\' 2"  (1.575 m)    General appearance: alert, cooperative and appears stated age Head: Normocephalic, without obvious abnormality, atraumatic Neck: no adenopathy, supple, symmetrical, trachea midline and thyroid normal to inspection and palpation Lungs: clear to auscultation bilaterally Cardiovascular: regular rate and rhythm Breasts: normal appearance, no masses or tenderness Abdomen: soft,  non-tender; non distended,  no masses,  no organomegaly Extremities: extremities normal, atraumatic, no cyanosis or edema Skin: Skin color, texture, turgor normal. No rashes or lesions Lymph nodes: Cervical, supraclavicular, and axillary nodes normal. No abnormal inguinal nodes palpated Neurologic: Grossly normal   Pelvic: External genitalia:  no lesions              Urethra:  normal appearing urethra with no masses, tenderness or lesions              Bartholins and Skenes: normal                 Vagina: normal appearing vagina with normal color and discharge, no lesions              Cervix: no lesions               Bimanual Exam:  Uterus:  normal size, contour, position, consistency, mobility, non-tender              Adnexa: no mass, fullness, tenderness               Rectovaginal: Confirms               Anus:  normal sphincter tone, no lesions  chaperoned for the exam.  1. Well woman exam Mammogram scheduled  2. Hot flashes - Follicle stimulating hormone  3. Night sweats - Follicle stimulating hormone  4. Screening for cervical cancer - Cytology - PAP  5. Colon cancer screening - Ambulatory referral to Gastroenterology  6. Encounter for surveillance of implantable subdermal contraceptive Will check an FSH to help determine the need for another nexplanon  7. Reaction to chronic stress Discussed possible use of an SSRI. Will get a copy of her labs from her primary  Addendum: Labs from primary were scanned Elevated LFT's

## 2021-07-07 LAB — CYTOLOGY - PAP
Comment: NEGATIVE
Diagnosis: UNDETERMINED — AB
High risk HPV: POSITIVE — AB

## 2021-07-07 LAB — FOLLICLE STIMULATING HORMONE: FSH: 62.9 m[IU]/mL

## 2021-07-22 ENCOUNTER — Ambulatory Visit (INDEPENDENT_AMBULATORY_CARE_PROVIDER_SITE_OTHER): Payer: BC Managed Care – PPO | Admitting: Obstetrics and Gynecology

## 2021-07-22 ENCOUNTER — Other Ambulatory Visit: Payer: Self-pay

## 2021-07-22 ENCOUNTER — Encounter: Payer: Self-pay | Admitting: Obstetrics and Gynecology

## 2021-07-22 DIAGNOSIS — R87810 Cervical high risk human papillomavirus (HPV) DNA test positive: Secondary | ICD-10-CM

## 2021-07-22 DIAGNOSIS — R87610 Atypical squamous cells of undetermined significance on cytologic smear of cervix (ASC-US): Secondary | ICD-10-CM

## 2021-07-22 DIAGNOSIS — Z113 Encounter for screening for infections with a predominantly sexual mode of transmission: Secondary | ICD-10-CM

## 2021-07-22 DIAGNOSIS — Z3046 Encounter for surveillance of implantable subdermal contraceptive: Secondary | ICD-10-CM

## 2021-07-22 DIAGNOSIS — Z01812 Encounter for preprocedural laboratory examination: Secondary | ICD-10-CM

## 2021-07-22 LAB — PREGNANCY, URINE: Preg Test, Ur: NEGATIVE

## 2021-07-22 NOTE — Progress Notes (Signed)
GYNECOLOGY  VISIT   HPI: 51 y.o.   Married White or Caucasian Not Hispanic or Latino  female   [phone] with No LMP recorded. Patient has had an implant.   here for Nexplanon exchange.   Would like to talk about pap results and colpo. She has been married x 27 years, recent pap with +HPV. She is not aware of having HPV in the past. Not aware that her husband is cheating. Wants full STD testing.   GYNECOLOGIC HISTORY: No LMP recorded. Patient has had an implant. Contraception: implant  but is expired  Menopausal hormone therapy: none         OB History     Gravida  5   Para  3   Term  3   Preterm      AB  1   Living  3      SAB  1   IAB      Ectopic      Multiple      Live Births                 Patient Active Problem List   Diagnosis Date Noted   Abdominal bloating 07/06/2021   Abnormal weight loss 07/06/2021   Change in bowel habit 07/06/2021   Colon cancer screening 07/06/2021   Rectal bleeding 07/06/2021   Upper abdominal pain 07/06/2021    Past Medical History:  Diagnosis Date   Anemia    Hypertension    STD (sexually transmitted disease)    Substance abuse (HCC)     Past Surgical History:  Procedure Laterality Date   KNEE ARTHROSCOPY     patient was 10    Current Outpatient Medications  Medication Sig Dispense Refill   Cholecalciferol (VITAMIN D3) 25 MCG (1000 UT) CAPS Take 1 capsule by mouth daily.     clindamycin (CLEOCIN T) 1 % lotion  (Patient not taking: Reported on 07/06/2021)     ergocalciferol (VITAMIN D2) 1.25 MG (50000 UT) capsule Vitamin D2 1,250 mcg (50,000 unit) capsule     etonogestrel (NEXPLANON) 68 MG IMPL implant Nexplanon 68 mg subdermal implant  Inject 1 implant by subcutaneous route as directed.     fluconazole (DIFLUCAN) 150 MG tablet fluconazole 150 mg tablet     methocarbamol (ROBAXIN) 500 MG tablet Take 500-1,000 mg by mouth every 6 (six) hours as needed for muscle spasms.      No current facility-administered  medications for this visit.     ALLERGIES: Penicillins and Erythromycin  Family History  Problem Relation Age of Onset   COPD Mother    Hypertension Father    Heart disease Father    Parkinson's disease Father    Breast cancer Sister 21   Hypertension Sister    Hypertension Brother    Breast cancer Maternal Aunt 71   Breast cancer Half-Sister 60    Social History   Socioeconomic History   Marital status: Married    Spouse name: Not on file   Number of children: Not on file   Years of education: Not on file   Highest education level: Not on file  Occupational History   Not on file  Tobacco Use   Smoking status: Every Day    Packs/day: 1.00    Types: Cigarettes    Passive exposure: Current   Smokeless tobacco: Never  Vaping Use   Vaping Use: Some days  Substance and Sexual Activity   Alcohol use: Yes    Alcohol/week: 5.0  standard drinks of alcohol    Types: 5 Shots of liquor per week   Drug use: Yes    Frequency: 7.0 times per week    Types: Marijuana, Cocaine   Sexual activity: Yes    Birth control/protection: Implant  Other Topics Concern   Not on file  Social History Narrative   Not on file   Social Determinants of Health   Financial Resource Strain: Not on file  Food Insecurity: Not on file  Transportation Needs: Not on file  Physical Activity: Not on file  Stress: Not on file  Social Connections: Not on file  Intimate Partner Violence: Not on file    Review of Systems  All other systems reviewed and are negative.   PHYSICAL EXAMINATION:    There were no vitals taken for this visit.    General appearance: alert, cooperative and appears stated age Risks of nexplanon removal and reinsertion were reviewed with the patient and a consent was signed.  The patient was placed in the supine position with her left arm bent at the elbow. The area was cleansed with betadine and injected with 1% lidocaine. A #11 blade was used to incise over the distal end  of the nexplanon implant. The implant was grasped with a clamp and removed.   More local anesthesia was placed along the path where the new nexplanon would be placed (position was moved further from the sulcus). The nexplanon device was inserted in the usual fashion without difficulty. Slight oozing from the insertion site was stopped with pressure. The device was palpated in place.  The patients arm was cleansed of betadine and a steri strip was placed over the incision. A gauze was wrapped around her arm.  She tolerated the procedure well  Instructions for care were discussed.     Pelvic: External genitalia:  no lesions              Urethra:  normal appearing urethra with no masses, tenderness or lesions              Bartholins and Skenes: normal                 Vagina: normal appearing vagina with normal color and discharge, no lesions              Cervix: no lesions             Chaperone was present for exam.  1. Encounter for removal and reinsertion of Nexplanon Care reviewed  2. ASCUS with positive high risk HPV cervical Discussed pap results, discussed HPV, discussed need for colposcopy. Will get a copy of her last pap to see if HPV was checked  3. Screening examination for STD (sexually transmitted disease) - SURESWAB CT/NG/T. vaginalis - RPR - HIV Antibody (routine testing w rflx) - Hepatitis C antibody - HSV(herpes simplex vrs) 1+2 ab-IgG  4. Pre-procedure lab exam - Pregnancy, urine

## 2021-07-23 LAB — HSV(HERPES SIMPLEX VRS) I + II AB-IGG
HAV 1 IGG,TYPE SPECIFIC AB: 40.6 index — ABNORMAL HIGH
HSV 2 IGG,TYPE SPECIFIC AB: 4.97 index — ABNORMAL HIGH

## 2021-07-23 LAB — HEPATITIS C ANTIBODY: Hepatitis C Ab: NONREACTIVE

## 2021-07-23 LAB — SURESWAB CT/NG/T. VAGINALIS
C. trachomatis RNA, TMA: NOT DETECTED
N. gonorrhoeae RNA, TMA: NOT DETECTED
Trichomonas vaginalis RNA: NOT DETECTED

## 2021-07-23 LAB — RPR: RPR Ser Ql: NONREACTIVE

## 2021-07-23 LAB — HIV ANTIBODY (ROUTINE TESTING W REFLEX): HIV 1&2 Ab, 4th Generation: NONREACTIVE

## 2021-07-30 ENCOUNTER — Encounter: Payer: Self-pay | Admitting: Obstetrics and Gynecology

## 2021-08-04 ENCOUNTER — Ambulatory Visit: Payer: BC Managed Care – PPO | Admitting: Obstetrics and Gynecology

## 2021-08-05 ENCOUNTER — Ambulatory Visit: Payer: Self-pay | Admitting: Obstetrics and Gynecology

## 2021-08-05 DIAGNOSIS — Z0289 Encounter for other administrative examinations: Secondary | ICD-10-CM

## 2021-08-16 ENCOUNTER — Telehealth: Payer: Self-pay | Admitting: Obstetrics and Gynecology

## 2021-08-16 NOTE — Telephone Encounter (Signed)
Patient no show for her colpo appointment on July 20.Message was lett on July 24 and 27 to call and reschedule appointment; patient has not called back.

## 2021-08-17 NOTE — Telephone Encounter (Signed)
Left message to call Noreene Larsson, RN at [facility], [phone], OPT 5 in f/u to recommendations from Dr. Oscar La.

## 2021-08-25 NOTE — Telephone Encounter (Signed)
Per review of Epic. Patient is scheduled for colpo on 08/30/21 at 11am.   Routing to Dr. Shirley Friar.   Encounter closed.

## 2021-08-30 ENCOUNTER — Other Ambulatory Visit (HOSPITAL_COMMUNITY)
Admission: RE | Admit: 2021-08-30 | Discharge: 2021-08-30 | Disposition: A | Discharge status: Home / Self Care | Payer: BC Managed Care – PPO | Source: Ambulatory Visit | Attending: Obstetrics and Gynecology | Admitting: Obstetrics and Gynecology

## 2021-08-30 ENCOUNTER — Ambulatory Visit (INDEPENDENT_AMBULATORY_CARE_PROVIDER_SITE_OTHER): Payer: BC Managed Care – PPO | Admitting: Obstetrics and Gynecology

## 2021-08-30 ENCOUNTER — Encounter: Payer: Self-pay | Admitting: Obstetrics and Gynecology

## 2021-08-30 VITALS — BP 132/74 | HR 88 | Ht 62.0 in | Wt 126.0 lb

## 2021-08-30 DIAGNOSIS — R87610 Atypical squamous cells of undetermined significance on cytologic smear of cervix (ASC-US): Secondary | ICD-10-CM | POA: Diagnosis present

## 2021-08-30 DIAGNOSIS — R87810 Cervical high risk human papillomavirus (HPV) DNA test positive: Secondary | ICD-10-CM

## 2021-08-30 NOTE — Progress Notes (Signed)
GYNECOLOGY  VISIT   HPI: 51 y.o.   Married White or Caucasian Not Hispanic or Latino  female   [phone] with No LMP recorded. Patient has had an implant.   here for colposcopy for an ascus, +HPV pap.  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   GYNECOLOGIC HISTORY: No LMP recorded. Patient has had an implant. Contraception:nexplanon  Menopausal hormone therapy: none         OB History     Gravida  5   Para  3   Term  3   Preterm      AB  1   Living  3      SAB  1   IAB      Ectopic      Multiple      Live Births                 Patient Active Problem List   Diagnosis Date Noted   Abdominal bloating 07/06/2021   Abnormal weight loss 07/06/2021   Change in bowel habit 07/06/2021   Colon cancer screening 07/06/2021   Rectal bleeding 07/06/2021   Upper abdominal pain 07/06/2021    Past Medical History:  Diagnosis Date   Anemia    Hypertension    STD (sexually transmitted disease)    Substance abuse (HCC)     Past Surgical History:  Procedure Laterality Date   KNEE ARTHROSCOPY     patient was 10    Current Outpatient Medications  Medication Sig Dispense Refill   Cholecalciferol (VITAMIN D3) 25 MCG (1000 UT) CAPS Take 1 capsule by mouth daily.     clindamycin (CLEOCIN T) 1 % lotion  (Patient not taking: Reported on 07/06/2021)     ergocalciferol (VITAMIN D2) 1.25 MG (50000 UT) capsule Vitamin D2 1,250 mcg (50,000 unit) capsule     etonogestrel (NEXPLANON) 68 MG IMPL implant Nexplanon 68 mg subdermal implant  Inject 1 implant by subcutaneous route as directed.     fluconazole (DIFLUCAN) 150 MG tablet fluconazole 150 mg tablet     methocarbamol (ROBAXIN) 500 MG tablet Take 500-1,000 mg by mouth every 6 (six) hours as needed for muscle spasms.      No current facility-administered medications for this visit.     ALLERGIES: Penicillins and Erythromycin  Family History  Problem Relation Age of Onset   COPD Mother     Hypertension Father    Heart disease Father    Parkinson's disease Father    Breast cancer Sister 65   Hypertension Sister    Hypertension Brother    Breast cancer Maternal Aunt 52   Breast cancer Half-Sister 49    Social History   Socioeconomic History   Marital status: Married    Spouse name: Not on file   Number of children: Not on file   Years of education: Not on file   Highest education level: Not on file  Occupational History   Not on file  Tobacco Use   Smoking status: Every Day    Packs/day: 1.00    Types: Cigarettes    Passive exposure: Current   Smokeless tobacco: Never  Vaping Use   Vaping Use: Some days  Substance and Sexual Activity   Alcohol use: Yes    Alcohol/week: 5.0 standard drinks of alcohol    Types: 5 Shots of liquor per week   Drug use: Yes    Frequency: 7.0 times per week  Types: Marijuana, Cocaine   Sexual activity: Yes    Birth control/protection: Implant  Other Topics Concern   Not on file  Social History Narrative   Not on file   Social Determinants of Health   Financial Resource Strain: Not on file  Food Insecurity: Not on file  Transportation Needs: Not on file  Physical Activity: Not on file  Stress: Not on file  Social Connections: Not on file  Intimate Partner Violence: Not on file    Review of Systems  All other systems reviewed and are negative.   PHYSICAL EXAMINATION:    There were no vitals taken for this visit.    General appearance: alert, cooperative and appears stated age  Pelvic: External genitalia:  no lesions              Urethra:  normal appearing urethra with no masses, tenderness or lesions              Bartholins and Skenes: normal                 Vagina: normal appearing vagina with normal color and discharge, no lesions              Cervix: no gross lesions  Colposcopy: not satisfactory, mild aceto-white changes. Biopsy taken at 2 o'clock, ECC done. Silver nitrate for hemostasis. Negative lugols  examination of the upper vagina.   Chaperone was present for exam.  1. ASCUS with positive high risk HPV cervical - Colposcopy - Surgical pathology( [facility]/ [facility])

## 2021-08-30 NOTE — Patient Instructions (Signed)

## 2021-09-01 LAB — SURGICAL PATHOLOGY

## 2021-10-28 ENCOUNTER — Encounter: Payer: Self-pay | Admitting: Nurse Practitioner

## 2021-12-01 ENCOUNTER — Ambulatory Visit: Payer: BC Managed Care – PPO | Admitting: Nurse Practitioner

## 2022-03-03 ENCOUNTER — Other Ambulatory Visit: Payer: Self-pay | Admitting: Family Medicine

## 2022-03-03 DIAGNOSIS — Z1231 Encounter for screening mammogram for malignant neoplasm of breast: Secondary | ICD-10-CM

## 2022-03-18 ENCOUNTER — Ambulatory Visit: Payer: BC Managed Care – PPO

## 2022-11-18 DIAGNOSIS — Z860101 Personal history of adenomatous and serrated colon polyps: Secondary | ICD-10-CM

## 2022-11-18 HISTORY — DX: Personal history of adenomatous and serrated colon polyps: Z86.0101

## 2022-11-18 HISTORY — PX: COLONOSCOPY WITH PROPOFOL: SHX5780

## 2022-12-13 LAB — HM COLONOSCOPY

## 2023-05-29 LAB — HM MAMMOGRAPHY

## 2023-06-29 ENCOUNTER — Ambulatory Visit: Admitting: Radiology

## 2023-07-26 ENCOUNTER — Encounter: Payer: Self-pay | Admitting: Radiology

## 2023-07-26 ENCOUNTER — Ambulatory Visit (INDEPENDENT_AMBULATORY_CARE_PROVIDER_SITE_OTHER): Admitting: Radiology

## 2023-07-26 ENCOUNTER — Other Ambulatory Visit (HOSPITAL_COMMUNITY)
Admission: RE | Admit: 2023-07-26 | Discharge: 2023-07-26 | Disposition: A | Discharge status: Home / Self Care | Source: Ambulatory Visit | Attending: Radiology | Admitting: Radiology

## 2023-07-26 VITALS — BP 144/86 | HR 93 | Ht 62.6 in | Wt 139.2 lb

## 2023-07-26 DIAGNOSIS — R200 Anesthesia of skin: Secondary | ICD-10-CM

## 2023-07-26 DIAGNOSIS — N870 Mild cervical dysplasia: Secondary | ICD-10-CM | POA: Diagnosis not present

## 2023-07-26 DIAGNOSIS — N912 Amenorrhea, unspecified: Secondary | ICD-10-CM | POA: Diagnosis not present

## 2023-07-26 DIAGNOSIS — R1031 Right lower quadrant pain: Secondary | ICD-10-CM

## 2023-07-26 DIAGNOSIS — N951 Menopausal and female climacteric states: Secondary | ICD-10-CM

## 2023-07-26 DIAGNOSIS — Z1331 Encounter for screening for depression: Secondary | ICD-10-CM | POA: Diagnosis not present

## 2023-07-26 DIAGNOSIS — Z975 Presence of (intrauterine) contraceptive device: Secondary | ICD-10-CM

## 2023-07-26 DIAGNOSIS — Z01419 Encounter for gynecological examination (general) (routine) without abnormal findings: Secondary | ICD-10-CM

## 2023-07-26 LAB — FOLLICLE STIMULATING HORMONE: FSH: 77.7 m[IU]/mL

## 2023-07-26 LAB — ESTRADIOL: Estradiol: 16 pg/mL

## 2023-07-26 NOTE — Patient Instructions (Signed)
 Preventive Care 16-55 Years Old, Female  Preventive care refers to lifestyle choices and visits with your health care provider that can promote health and wellness. Preventive care visits are also called wellness exams.  What can I expect for my preventive care visit?  Counseling  Your health care provider may ask you questions about your:  Medical history, including:  Past medical problems.  Family medical history.  Pregnancy history.  Current health, including:  Menstrual cycle.  Method of birth control.  Emotional well-being.  Home life and relationship well-being.  Sexual activity and sexual health.  Lifestyle, including:  Alcohol, nicotine or tobacco, and drug use.  Access to firearms.  Diet, exercise, and sleep habits.  Work and work Astronomer.  Sunscreen use.  Safety issues such as seatbelt and bike helmet use.  Physical exam  Your health care provider will check your:  Height and weight. These may be used to calculate your BMI (body mass index). BMI is a measurement that tells if you are at a healthy weight.  Waist circumference. This measures the distance around your waistline. This measurement also tells if you are at a healthy weight and may help predict your risk of certain diseases, such as type 2 diabetes and high blood pressure.  Heart rate and blood pressure.  Body temperature.  Skin for abnormal spots.  What immunizations do I need?    Vaccines are usually given at various ages, according to a schedule. Your health care provider will recommend vaccines for you based on your age, medical history, and lifestyle or other factors, such as travel or where you work.  What tests do I need?  Screening  Your health care provider may recommend screening tests for certain conditions. This may include:  Lipid and cholesterol levels.  Diabetes screening. This is done by checking your blood sugar (glucose) after you have not eaten for a while (fasting).  Pelvic exam and Pap test.  Hepatitis B test.  Hepatitis C  test.  HIV (human immunodeficiency virus) test.  STI (sexually transmitted infection) testing, if you are at risk.  Lung cancer screening.  Colorectal cancer screening.  Mammogram. Talk with your health care provider about when you should start having regular mammograms. This may depend on whether you have a family history of breast cancer.  BRCA-related cancer screening. This may be done if you have a family history of breast, ovarian, tubal, or peritoneal cancers.  Bone density scan. This is done to screen for osteoporosis.  Talk with your health care provider about your test results, treatment options, and if necessary, the need for more tests.  Follow these instructions at home:  Eating and drinking    Eat a diet that includes fresh fruits and vegetables, whole grains, lean protein, and low-fat dairy products.  Take vitamin and mineral supplements as recommended by your health care provider.  Do not drink alcohol if:  Your health care provider tells you not to drink.  You are pregnant, may be pregnant, or are planning to become pregnant.  If you drink alcohol:  Limit how much you have to 0-1 drink a day.  Know how much alcohol is in your drink. In the [place] one drink equals one 12 oz bottle of beer (355 mL), one 5 oz glass of wine (148 mL), or one 1 oz glass of hard liquor (44 mL).  Lifestyle  Brush your teeth every morning and night with fluoride toothpaste. Floss one time each day.  Exercise for at least  30 minutes 5 or more days each week.  Do not use any products that contain nicotine or tobacco. These products include cigarettes, chewing tobacco, and vaping devices, such as e-cigarettes. If you need help quitting, ask your health care provider.  Do not use drugs.  If you are sexually active, practice safe sex. Use a condom or other form of protection to prevent STIs.  If you do not wish to become pregnant, use a form of birth control. If you plan to become pregnant, see your health care provider for a  prepregnancy visit.  Take aspirin only as told by your health care provider. Make sure that you understand how much to take and what form to take. Work with your health care provider to find out whether it is safe and beneficial for you to take aspirin daily.  Find healthy ways to manage stress, such as:  Meditation, yoga, or listening to music.  Journaling.  Talking to a trusted person.  Spending time with friends and family.  Minimize exposure to UV radiation to reduce your risk of skin cancer.  Safety  Always wear your seat belt while driving or riding in a vehicle.  Do not drive:  If you have been drinking alcohol. Do not ride with someone who has been drinking.  When you are tired or distracted.  While texting.  If you have been using any mind-altering substances or drugs.  Wear a helmet and other protective equipment during sports activities.  If you have firearms in your house, make sure you follow all gun safety procedures.  Seek help if you have been physically or sexually abused.  What's next?  Visit your health care provider once a year for an annual wellness visit.  Ask your health care provider how often you should have your eyes and teeth checked.  Stay up to date on all vaccines.  This information is not intended to replace advice given to you by your health care provider. Make sure you discuss any questions you have with your health care provider.  Document Revised: 07/01/2020 Document Reviewed: 07/01/2020  Elsevier Patient Education  2024 [employer].

## 2023-07-26 NOTE — Progress Notes (Addendum)
 Sareen Randon 02/10/1970 [id number]   History:  53 y.o. G5P3 presents for annual exam. C/o hot flashes, night sweats. No periods with Nexplanon , think her LMP was 2016. RLQ pain on and off and a pain in her cervix. Overwhelmed, has a sick child and husband. Reports some depression symptoms on and off. Also has numbness in her right hand x 2 days.   Gynecologic History No LMP recorded. Patient has had an implant.   Contraception/Family planning: Nexplanon  inserted 07/2021 Sexually active: yes Last Pap: 2023. Results were: abnormal, colpo CIN 1 Last mammogram: 6/25. Results were: normal Colonoscopy: 3/23  Obstetric History OB History  Gravida Para Term Preterm AB Living  5 3 3  1 3   SAB IAB Ectopic Multiple Live Births  1        # Outcome Date GA Lbr Len/2nd Weight Sex Type Anes PTL Lv  5 Gravida           4 Term           3 Term           2 Term           1 SAB                07/26/2023   10:52 AM  Depression screen PHQ 2/9  Decreased Interest 2  Down, Depressed, Hopeless 2  PHQ - 2 Score 4  Altered sleeping 2  Tired, decreased energy 3  Change in appetite 3  Feeling bad or failure about yourself  0  Trouble concentrating 1  Moving slowly or fidgety/restless 0  Suicidal thoughts 0  PHQ-9 Score 13  Difficult doing work/chores Somewhat difficult     The following portions of the patient's history were reviewed and updated as appropriate: allergies, current medications, past family history, past medical history, past social history, past surgical history, and problem list.  Review of Systems  All other systems reviewed and are negative.   Past medical history, past surgical history, family history and social history were all reviewed and documented in the EPIC chart.  Exam:  Vitals:   07/26/23 1044  BP: (!) 140/78  Pulse: 93  SpO2: 97%  Weight: 139 lb 3.2 oz (63.1 kg)  Height: 5' 2.6 (1.59 m)   Body mass index is 24.98 kg/m.  Physical Exam Vitals and  nursing note reviewed. Exam conducted with a chaperone present.  Constitutional:      Appearance: Normal appearance. She is normal weight.  HENT:     Head: Normocephalic and atraumatic.  Neck:     Thyroid: No thyroid mass, thyromegaly or thyroid tenderness.  Cardiovascular:     Rate and Rhythm: Regular rhythm.     Heart sounds: Normal heart sounds.  Pulmonary:     Effort: Pulmonary effort is normal.     Breath sounds: Normal breath sounds.  Chest:  Breasts:    Breasts are symmetrical.     Right: Normal. No inverted nipple, mass, nipple discharge, skin change or tenderness.     Left: Normal. No inverted nipple, mass, nipple discharge, skin change or tenderness.  Abdominal:     General: Abdomen is flat. Bowel sounds are normal.     Palpations: Abdomen is soft.  Genitourinary:    General: Normal vulva.     Vagina: Normal. No vaginal discharge, bleeding or lesions.     Cervix: Normal. No discharge or lesion.     Uterus: Normal. Not enlarged and not tender.  Adnexa: Right adnexa normal and left adnexa normal.       Right: No mass, tenderness or fullness.         Left: No mass, tenderness or fullness.    Lymphadenopathy:     Upper Body:     Right upper body: No axillary adenopathy.     Left upper body: No axillary adenopathy.  Skin:    General: Skin is warm and dry.  Neurological:     Mental Status: She is alert and oriented to person, place, and time.  Psychiatric:        Mood and Affect: Mood normal.        Thought Content: Thought content normal.        Judgment: Judgment normal.      Darice Hoit, CMA present for exam  Assessment/Plan:   1. Well woman exam with routine gynecological exam (Primary) - Cytology - PAP( [facility])  2. Dysplasia of cervix, low grade (CIN 1)  3. Nexplanon  in place Due to be removed by 07/2024  4. Depression screening  Positive, declines meds, will reach out to therapist to schedule  5. Amenorrhea - [facility] - Estradiol   6. Vasomotor  symptoms due to menopause - [facility] - Estradiol   7. RLQ abdominal pain - US  PELVIS TRANSVAGINAL NON-OB (TV ONLY); Future   8. Numbness of right hand Reassured hand/arm is warm and reactive. Follow up with PCP.    Return in about 1 year (around 07/25/2024) for Annual. Total time with patient was 43 mins addressing her concerns.   GINETTE COZIER B WHNP-BC 10:58 AM 07/26/2023

## 2023-07-26 NOTE — Addendum Note (Signed)
 Addended by: Celes Dedic on: 07/26/2023 11:16 AM   Modules accepted: Level of Service

## 2023-07-28 ENCOUNTER — Ambulatory Visit: Payer: Self-pay | Admitting: Radiology

## 2023-07-28 DIAGNOSIS — R87613 High grade squamous intraepithelial lesion on cytologic smear of cervix (HGSIL): Secondary | ICD-10-CM

## 2023-07-28 DIAGNOSIS — B977 Papillomavirus as the cause of diseases classified elsewhere: Secondary | ICD-10-CM

## 2023-08-02 LAB — CYTOLOGY - PAP
Comment: NEGATIVE
Comment: NEGATIVE
Comment: NEGATIVE
Diagnosis: HIGH — AB
HPV 16: NEGATIVE
HPV 18 / 45: NEGATIVE
High risk HPV: POSITIVE — AB

## 2023-08-15 ENCOUNTER — Ambulatory Visit (INDEPENDENT_AMBULATORY_CARE_PROVIDER_SITE_OTHER): Admitting: Radiology

## 2023-08-15 ENCOUNTER — Ambulatory Visit (INDEPENDENT_AMBULATORY_CARE_PROVIDER_SITE_OTHER)

## 2023-08-15 VITALS — BP 112/72 | HR 78 | Wt 141.8 lb

## 2023-08-15 DIAGNOSIS — R1031 Right lower quadrant pain: Secondary | ICD-10-CM

## 2023-08-15 DIAGNOSIS — Z975 Presence of (intrauterine) contraceptive device: Secondary | ICD-10-CM | POA: Diagnosis not present

## 2023-08-15 NOTE — Progress Notes (Signed)
      Subjective: Destiny Travis is a 53 y.o. adult here for f/u u/s for RLQ pain.     Review of Systems  All other systems reviewed and are negative.   Past Medical History:  Diagnosis Date   Anemia    Hypertension    STD (sexually transmitted disease)    Substance abuse (HCC)       Objective:  Today's Vitals   08/15/23 0916  BP: 112/72  Pulse: 78  SpO2: 96%  Weight: 141 lb 12.8 oz (64.3 kg)   Body mass index is 25.44 kg/m.   Narrative & Impression Indication: RLQ pain   Vaginal ultrasound   Anteverted uterus normal size and shape 6.06 x 4.06 x 2.45 cm No myometrial masses Free fluid in fundal region   Thin symmetrical endometrium 1.45mm No masses or thickening seen   Both ovaries atrophic in size with normal perfusion   No adnexal masses   No free fluid   Impression:normal gyn u/s       Exam Ended: 08/15/23 09:09 Last Resulted: 08/15/23 09:39     Assessment:/Plan:  1. RLQ abdominal pain (Primary) Reassured normal gyn u/s  2. Nexplanon  in place Labs show she is post menopausal, schedule Nexplanon  removal. Counseled on HRT.   Tanner Yeley B, NP 9:43 AM

## 2023-08-21 ENCOUNTER — Encounter: Payer: Self-pay | Admitting: Radiology

## 2023-08-21 ENCOUNTER — Ambulatory Visit (INDEPENDENT_AMBULATORY_CARE_PROVIDER_SITE_OTHER): Admitting: Radiology

## 2023-08-21 ENCOUNTER — Other Ambulatory Visit (HOSPITAL_COMMUNITY)
Admission: RE | Admit: 2023-08-21 | Discharge: 2023-08-21 | Disposition: A | Discharge status: Home / Self Care | Source: Ambulatory Visit | Attending: Radiology | Admitting: Radiology

## 2023-08-21 DIAGNOSIS — R87613 High grade squamous intraepithelial lesion on cytologic smear of cervix (HGSIL): Secondary | ICD-10-CM | POA: Diagnosis present

## 2023-08-21 DIAGNOSIS — B977 Papillomavirus as the cause of diseases classified elsewhere: Secondary | ICD-10-CM

## 2023-08-21 NOTE — Progress Notes (Signed)
 53 y.o. [id number] female here for colposcopy.   No LMP recorded. Patient has had an implant.   CIN 1 colposcopy 08/2021  Last PAP:    Component Value Date/Time   DIAGPAP (A) 07/26/2023 1058    - High grade squamous intraepithelial lesion (HSIL)   DIAGPAP (A) 07/06/2021 1138    - Atypical squamous cells of undetermined significance (ASC-US )   HPVHIGH Positive (A) 07/26/2023 1058   HPVHIGH Positive (A) 07/06/2021 1138   ADEQPAP  07/26/2023 1058    Satisfactory for evaluation; transformation zone component PRESENT.   ADEQPAP  07/06/2021 1138    Satisfactory for evaluation; transformation zone component PRESENT.     OB History  Gravida Para Term Preterm AB Living  4 3 3  1 3   SAB IAB Ectopic Multiple Live Births  1        # Outcome Date GA Lbr Len/2nd Weight Sex Type Anes PTL Lv  4 Term           3 Term           2 Term           1 SAB             Past Medical History:  Diagnosis Date   Anemia    Hypertension    STD (sexually transmitted disease)    Substance abuse (HCC)     Past Surgical History:  Procedure Laterality Date   KNEE ARTHROSCOPY     patient was 14    Current Outpatient Medications on File Prior to Visit  Medication Sig Dispense Refill   amLODipine (NORVASC) 10 MG tablet Take 10 mg by mouth daily.     Cholecalciferol (VITAMIN D3) 25 MCG (1000 UT) CAPS Take 1 capsule by mouth daily.     clindamycin (CLEOCIN T) 1 % lotion      ergocalciferol (VITAMIN D2) 1.25 MG (50000 UT) capsule Vitamin D2 1,250 mcg (50,000 unit) capsule     etonogestrel  (NEXPLANON ) 68 MG IMPL implant Inserted 07/22/21 LEFT arm     losartan-hydrochlorothiazide (HYZAAR) 100-25 MG tablet Take 1 tablet by mouth daily.     methocarbamol (ROBAXIN) 500 MG tablet Take 500-1,000 mg by mouth every 6 (six) hours as needed for muscle spasms.      fluconazole (DIFLUCAN) 150 MG tablet fluconazole 150 mg tablet (Patient not taking: Reported on 08/15/2023)     No current facility-administered  medications on file prior to visit.    Allergies  Allergen Reactions   Penicillins Anaphylaxis   Erythromycin Hives      PE Today's Vitals   08/21/23 1504  BP: 120/82  Pulse: 72  SpO2: 98%  Weight: 141 lb 9.6 oz (64.2 kg)   Body mass index is 25.41 kg/m.  Physical Exam Vitals and nursing note reviewed.  Constitutional:      Appearance: Normal appearance.  Pulmonary:     Effort: Pulmonary effort is normal.  Genitourinary:    Neurological:     Mental Status: She is alert.  Psychiatric:        Mood and Affect: Mood normal.        Thought Content: Thought content normal.        Judgment: Judgment normal.      Colposcopy Procedure Consented for procedure.  Time out performed. Speculum placed in vagina.  Acetic acid 3% was applied to cervix.  Satisfactory colposcopy. TZ was completely seen. Biopsies taken  Location(s) - 5, 7, 12 oclock  Findings: CIN2/3  ECC was performed. Specimens to pathology separately.  Monsel's applied to biopsy areas.   Good hemostasis.  Minimal EBL. No complications.  Tolerated well.     Assessment and Plan:       1. HGSIL (high grade squamous intraepithelial lesion) on Pap smear of cervix - Colposcopy - Surgical pathology( [facility])  2. HPV in female - Colposcopy    Abnormal PAP results reviewed. ASCCP guidelines reviewed. Consents signed. Satisfactory colposcopy, ECC and biopsy performed. Aftercare instructions provided.  Will contact with results.  Dao Mearns B, NP

## 2023-08-23 ENCOUNTER — Ambulatory Visit: Payer: Self-pay | Admitting: Radiology

## 2023-08-23 LAB — SURGICAL PATHOLOGY

## 2023-09-27 ENCOUNTER — Ambulatory Visit (INDEPENDENT_AMBULATORY_CARE_PROVIDER_SITE_OTHER): Admitting: Obstetrics and Gynecology

## 2023-09-27 ENCOUNTER — Encounter: Payer: Self-pay | Admitting: Obstetrics and Gynecology

## 2023-09-27 VITALS — BP 130/80 | HR 94 | Wt 140.8 lb

## 2023-09-27 DIAGNOSIS — Z01818 Encounter for other preprocedural examination: Secondary | ICD-10-CM | POA: Diagnosis not present

## 2023-09-27 DIAGNOSIS — D069 Carcinoma in situ of cervix, unspecified: Secondary | ICD-10-CM

## 2023-09-27 DIAGNOSIS — Z1211 Encounter for screening for malignant neoplasm of colon: Secondary | ICD-10-CM

## 2023-09-27 DIAGNOSIS — F17200 Nicotine dependence, unspecified, uncomplicated: Secondary | ICD-10-CM | POA: Diagnosis not present

## 2023-09-27 DIAGNOSIS — E2839 Other primary ovarian failure: Secondary | ICD-10-CM

## 2023-09-27 DIAGNOSIS — Z1231 Encounter for screening mammogram for malignant neoplasm of breast: Secondary | ICD-10-CM

## 2023-09-27 MED ORDER — ESTRADIOL 0.1 MG/GM VA CREA: TOPICAL_CREAM | VAGINAL | 12 refills | Status: AC

## 2023-09-27 NOTE — Patient Instructions (Addendum)
 Hi [name], I am going to put in a bone density to check for osteopenia and osteoporosis as well for baseline, with the menopause and smoking. I will send that to [facility].  I also put in for the yearly mammogram and screening colonoscopy. Please let us  know if you have done these already Dr. Glennon Kate Africa will reach out to you about surgery scheduling. Take motrin an hour before the endometrial biopsy See you soon.

## 2023-09-27 NOTE — Progress Notes (Addendum)
 53 y.o. y.o. 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  She is menopausal for several years and denies any PMB Patient also with nexplanon  in left arm and would like to have this removed with surgery. Does report menstrual like cramping   Body mass index is 25.26 kg/m.  Blood pressure 130/80, pulse 94, weight 140 lb 12.8 oz (63.9 kg), SpO2 96%.  No LMP recorded. Patient has had an implant.    US  PELVIS TRANSVAGINAL NON-OB (TV ONLY) (Accession [id number]) (Order [id number]) Imaging Date: 08/15/2023 Department: [facility] Released By: Job Dena RAMAN Authorizing: Chrzanowski, Shasta NOVAK, NP   Exam Status  Status  Final [99]   PACS Intelerad Image Link   Show images for US  PELVIS TRANSVAGINAL NON-OB (TV ONLY) Study Result  Narrative & Impression  Indication: RLQ pain   Vaginal ultrasound   Anteverted uterus normal size and shape 6.06 x 4.06 x 2.45 cm No myometrial masses Free fluid in fundal region   Thin symmetrical endometrium 1.5mm No masses or thickening seen   Both ovaries atrophic in size with normal perfusion   No adnexal masses   No free fluid   Impression:normal gyn u/s      Result History  US  PELVIS TRANSVAGINAL NON-OB (TV ONLY) (Order #[id number]) on 08/15/2023 - Order Result History Report  MyChart Results Release  Body mass index is 25.26 kg/m.   Blood pressure 130/80, pulse 94, weight 140 lb 12.8 oz (63.9 kg), SpO2 96%.     Component Value Date/Time   DIAGPAP (A) 07/26/2023 1058    - High grade squamous intraepithelial lesion (HSIL)   DIAGPAP (A) 07/06/2021 1138    - Atypical squamous  cells of undetermined significance (ASC-US )   HPVHIGH Positive (A) 07/26/2023 1058   HPVHIGH Positive (A) 07/06/2021 1138   ADEQPAP  07/26/2023 1058    Satisfactory for evaluation; transformation zone component PRESENT.   ADEQPAP  07/06/2021 1138    Satisfactory for evaluation; transformation zone component PRESENT.    GYN HISTORY:    Component Value Date/Time   DIAGPAP (A) 07/26/2023 1058    - High grade squamous intraepithelial lesion (HSIL)   DIAGPAP (A) 07/06/2021 1138    - Atypical squamous cells of undetermined significance (ASC-US )   HPVHIGH Positive (A) 07/26/2023 1058   HPVHIGH Positive (A) 07/06/2021 1138   ADEQPAP  07/26/2023 1058    Satisfactory for evaluation; transformation zone component PRESENT.   ADEQPAP  07/06/2021 1138    Satisfactory for evaluation; transformation zone component PRESENT.    OB History  Gravida Para Term Preterm AB Living  4 3 3  1 3   SAB IAB Ectopic Multiple Live Births  1        # Outcome Date GA Lbr Len/2nd Weight Sex Type Anes PTL Lv  4 Term           3 Term           2 Term           1 SAB             Past Medical History:  Diagnosis Date  Anemia    Hypertension    STD (sexually transmitted disease)    Substance abuse (HCC)     Past Surgical History:  Procedure Laterality Date   KNEE ARTHROSCOPY     patient was 10    Current Outpatient Medications on File Prior to Visit  Medication Sig Dispense Refill   amLODipine (NORVASC) 10 MG tablet Take 10 mg by mouth daily.     Cholecalciferol (VITAMIN D3) 25 MCG (1000 UT) CAPS Take 1 capsule by mouth daily.     clindamycin (CLEOCIN T) 1 % lotion      ergocalciferol (VITAMIN D2) 1.25 MG (50000 UT) capsule Vitamin D2 1,250 mcg (50,000 unit) capsule     etonogestrel  (NEXPLANON ) 68 MG IMPL implant Inserted 07/22/21 LEFT arm     losartan-hydrochlorothiazide (HYZAAR) 100-25 MG tablet Take 1 tablet by mouth daily.     methocarbamol (ROBAXIN) 500 MG tablet Take 500-1,000 mg by mouth  every 6 (six) hours as needed for muscle spasms.      fluconazole (DIFLUCAN) 150 MG tablet fluconazole 150 mg tablet (Patient not taking: Reported on 09/27/2023)     No current facility-administered medications on file prior to visit.    Social History   Socioeconomic History   Marital status: Married    Spouse name: Not on file   Number of children: Not on file   Years of education: Not on file   Highest education level: Not on file  Occupational History   Not on file  Tobacco Use   Smoking status: Every Day    Current packs/day: 1.00    Types: Cigarettes    Passive exposure: Current   Smokeless tobacco: Never  Vaping Use   Vaping status: Some Days  Substance and Sexual Activity   Alcohol use: Yes    Alcohol/week: 5.0 standard drinks of alcohol    Types: 5 Shots of liquor per week    Comment: 350 ml   Drug use: Yes    Frequency: 7.0 times per week    Types: Marijuana, Cocaine   Sexual activity: Yes    Birth control/protection: Implant    Comment: Mens. 9yr sexual debut 53yrsold  Other Topics Concern   Not on file  Social History Narrative   Not on file   Social Drivers of Health   Financial Resource Strain: Not on file  Food Insecurity: Not on file  Transportation Needs: Not on file  Physical Activity: Not on file  Stress: Not on file  Social Connections: Not on file  Intimate Partner Violence: Not on file    Family History  Problem Relation Age of Onset   COPD Mother    Hypertension Father    Heart disease Father    Parkinson's disease Father    Breast cancer Sister 44   Hypertension Sister    Hypertension Brother    Breast cancer Maternal Aunt 51   Breast cancer Half-Sister 24     Allergies  Allergen Reactions   Penicillins Anaphylaxis   Erythromycin Hives      Patient's last menstrual period was No LMP recorded. Patient has had an implant..            Review of Systems Alls systems reviewed and are negative.     OBGyn Exam    A:         Longstanding abnormal pap smears CINII Positive ECC Pelvic cramping Smoker CHTN  P:        Counseled on option with CKC vs. RLH/BSO.  The  R/b/a/I of each were reviewed in detail with the patient.  She and her husband would like to have the RLH, BSO, Cystoscopy along with nexplanon  removal.   Counseled extensively on the procedure including but not limited to what to expect and risks and benefits.  Counseled on postop care and pelvic rest for 10-13 weeks after the surgery with restricted lifting for 6 weeks after of 13 lbs.  Counseled on the benefits of the robotic procedure with faster return to daily activities, improved outcomes, and less risk for complications. She would like to have this scheduled. To send to surgery scheduling. To return for EMB before surgery.  She will schedule this today Counseled on the importance of smoking cessation as well. To begin vaginal estrogen 3 times a week now until surgery and then daily after surgery until 13 weeks after then resume at 3 times a week for vaginal cuff healing.  40 minutes spent on reviewing records, imaging,  and one on one patient time and counseling patient and documentation Dr. Glennon  No follow-ups on file.  Almarie MARLA Glennon

## 2023-10-04 ENCOUNTER — Encounter: Payer: Self-pay | Admitting: Obstetrics and Gynecology

## 2023-10-04 ENCOUNTER — Ambulatory Visit (INDEPENDENT_AMBULATORY_CARE_PROVIDER_SITE_OTHER): Admitting: Obstetrics and Gynecology

## 2023-10-04 ENCOUNTER — Other Ambulatory Visit (HOSPITAL_COMMUNITY)
Admission: RE | Admit: 2023-10-04 | Discharge: 2023-10-04 | Disposition: A | Discharge status: Home / Self Care | Source: Ambulatory Visit | Attending: Obstetrics and Gynecology | Admitting: Obstetrics and Gynecology

## 2023-10-04 VITALS — BP 130/80 | HR 88 | Ht 62.0 in | Wt 139.0 lb

## 2023-10-04 DIAGNOSIS — Z01818 Encounter for other preprocedural examination: Secondary | ICD-10-CM | POA: Diagnosis present

## 2023-10-04 DIAGNOSIS — F17200 Nicotine dependence, unspecified, uncomplicated: Secondary | ICD-10-CM

## 2023-10-04 DIAGNOSIS — Z23 Encounter for immunization: Secondary | ICD-10-CM

## 2023-10-04 DIAGNOSIS — N898 Other specified noninflammatory disorders of vagina: Secondary | ICD-10-CM

## 2023-10-04 DIAGNOSIS — D069 Carcinoma in situ of cervix, unspecified: Secondary | ICD-10-CM | POA: Insufficient documentation

## 2023-10-04 NOTE — Patient Instructions (Signed)
 Locations you can schedule your bone scan are:  The [facility] bone scan location scheduling line is [phone]  [facility] is [phone]  [facility] radiology [phone]   [facility] [phone]  [facility]: [phone]    Another option if they are behind is [facility] and their number is [phone].  I can send the referral if you want to go there.    Please let me know if you have any trouble scheduling. This is important for management of osteoporosis or osteopenia.   I can sit down with you after the scan to discuss results and treatment.  Dr. Glennon

## 2023-10-04 NOTE — Progress Notes (Signed)
 53 y.o. y.o. 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  She is menopausal for several years and denies any PMB Patient also with nexplanon  in left arm and would like to have this removed with surgery. Does report menstrual like cramping   Body mass index is 25.42 kg/m.  Blood pressure 130/80, pulse 88, height 5' 2 (1.575 m), weight 139 lb (63 kg), SpO2 98%.  No LMP recorded. Patient has had an implant.   [facility]G: 2025 at [facility] center Colonoscopy: 2024 at [facility]. To get records. Reports normal and repeat in 10 years. Dxa: referral placed Gardesil: none. counseled on importance. Patient to check pricing  US  PELVIS TRANSVAGINAL NON-OB (TV ONLY) (Accession [id number]) (Order [id number]) Imaging Date: 08/15/2023 Department: [facility] Released By: Job Dena RAMAN Authorizing: Chrzanowski, Shasta NOVAK, NP   Exam Status  Status  Final [99]   PACS Intelerad Image Link   Show images for US  PELVIS TRANSVAGINAL NON-OB (TV ONLY) Study Result  Narrative & Impression  Indication: RLQ pain   Vaginal ultrasound   Anteverted uterus normal size and shape 6.06 x 4.06 x 2.45 cm No myometrial masses Free fluid in fundal region   Thin symmetrical endometrium 1.35mm No masses or thickening seen   Both ovaries atrophic in size with normal perfusion   No adnexal masses   No free fluid   Impression:normal gyn u/s      Result History  US  PELVIS TRANSVAGINAL NON-OB (TV ONLY) (Order #[id number]) on 08/15/2023 - Order Result History Report  MyChart Results Release  Body mass index is 25.42  kg/m.   Blood pressure 130/80, pulse 88, height 5' 2 (1.575 m), weight 139 lb (63 kg), SpO2 98%.     Component Value Date/Time   DIAGPAP (A) 07/26/2023 1058    - High grade squamous intraepithelial lesion (HSIL)   DIAGPAP (A) 07/06/2021 1138    - Atypical squamous cells of undetermined significance (ASC-US )   HPVHIGH Positive (A) 07/26/2023 1058   HPVHIGH Positive (A) 07/06/2021 1138   ADEQPAP  07/26/2023 1058    Satisfactory for evaluation; transformation zone component PRESENT.   ADEQPAP  07/06/2021 1138    Satisfactory for evaluation; transformation zone component PRESENT.    GYN HISTORY:    Component Value Date/Time   DIAGPAP (A) 07/26/2023 1058    - High grade squamous intraepithelial lesion (HSIL)   DIAGPAP (A) 07/06/2021 1138    - Atypical squamous cells of undetermined significance (ASC-US )   HPVHIGH Positive (A) 07/26/2023 1058   HPVHIGH Positive (A) 07/06/2021 1138   ADEQPAP  07/26/2023 1058    Satisfactory for evaluation; transformation zone component PRESENT.   ADEQPAP  07/06/2021 1138    Satisfactory for evaluation; transformation zone component PRESENT.    OB History  Gravida Para Term Preterm AB Living  4 3 3  1 3   SAB IAB Ectopic Multiple Live Births  1        # Outcome Date GA Lbr Len/2nd Weight Sex Type Anes PTL Lv  4 Term  3 Term           2 Term           1 SAB             Past Medical History:  Diagnosis Date   Anemia    Hypertension    STD (sexually transmitted disease)    Substance abuse (HCC)     Past Surgical History:  Procedure Laterality Date   KNEE ARTHROSCOPY     patient was 10    Current Outpatient Medications on File Prior to Visit  Medication Sig Dispense Refill   amLODipine (NORVASC) 10 MG tablet Take 10 mg by mouth daily.     Cholecalciferol (VITAMIN D3) 25 MCG (1000 UT) CAPS Take 1 capsule by mouth daily.     clindamycin (CLEOCIN T) 1 % lotion      ergocalciferol (VITAMIN D2) 1.25 MG (50000 UT) capsule  Vitamin D2 1,250 mcg (50,000 unit) capsule     estradiol  (ESTRACE  VAGINAL) 0.1 MG/GM vaginal cream Rub pea size amount each night for 3 weeks then 3 times a week thereafter. 42.5 g 12   etonogestrel  (NEXPLANON ) 68 MG IMPL implant Inserted 07/22/21 LEFT arm     fluconazole (DIFLUCAN) 150 MG tablet fluconazole 150 mg tablet     losartan-hydrochlorothiazide (HYZAAR) 100-25 MG tablet Take 1 tablet by mouth daily.     methocarbamol (ROBAXIN) 500 MG tablet Take 500-1,000 mg by mouth every 6 (six) hours as needed for muscle spasms.      No current facility-administered medications on file prior to visit.    Social History   Socioeconomic History   Marital status: Married    Spouse name: Not on file   Number of children: Not on file   Years of education: Not on file   Highest education level: Not on file  Occupational History   Not on file  Tobacco Use   Smoking status: Every Day    Current packs/day: 1.00    Types: Cigarettes    Passive exposure: Current   Smokeless tobacco: Never  Vaping Use   Vaping status: Some Days  Substance and Sexual Activity   Alcohol use: Yes    Alcohol/week: 5.0 standard drinks of alcohol    Types: 5 Shots of liquor per week    Comment: 350 ml   Drug use: Yes    Frequency: 7.0 times per week    Types: Marijuana, Cocaine   Sexual activity: Yes    Birth control/protection: Implant    Comment: Mens. 65yr sexual debut 53yrsold  Other Topics Concern   Not on file  Social History Narrative   Not on file   Social Drivers of Health   Financial Resource Strain: Not on file  Food Insecurity: Not on file  Transportation Needs: Not on file  Physical Activity: Not on file  Stress: Not on file  Social Connections: Not on file  Intimate Partner Violence: Not on file    Family History  Problem Relation Age of Onset   COPD Mother    Hypertension Father    Heart disease Father    Parkinson's disease Father    Breast cancer Sister 16   Hypertension  Sister    Hypertension Brother    Breast cancer Maternal Aunt 7   Breast cancer Half-Sister 22     Allergies  Allergen Reactions   Penicillins Anaphylaxis    Other Reaction(s): Unknown   Erythromycin Hives      Patient's last menstrual period  was No LMP recorded. Patient has had an implant..            Review of Systems Alls systems reviewed and are negative.     OBGyn Exam   TIME OUT PERFORMED PROCEDURE: EMB Consent obtained for the procedure.  A bivalve speculum was placed in the vagina.  The cervix was grasped with a single tooth tenaculum.  Pipelle was inserted and rotated. Uterus sound to 8cm.  Adequate specimen was obtained and sent to pathology.  All instruments were removed.  Patient tolerated the procedure well.  To notify patient of the results.  A:        Longstanding abnormal pap smears CINII Positive ECC Pelvic cramping Smoker CHTN EMB prior to having RLH                             P:       EMB collected today.  Flu shot given today EMB collected today. Message sent to surgery scheduling. Counseled on gardesil and off label use and importance. Patient will check with the front on pricing and schedule, if she desires this. Possible BV seen on exam. Nuswab sent   No follow-ups on file.  Destiny Travis Carpen

## 2023-10-05 ENCOUNTER — Ambulatory Visit: Payer: Self-pay | Admitting: Obstetrics and Gynecology

## 2023-10-05 LAB — SURESWAB® ADVANCED VAGINITIS PLUS,TMA
C. trachomatis RNA, TMA: NOT DETECTED
CANDIDA SPECIES: NOT DETECTED
Candida glabrata: NOT DETECTED
N. gonorrhoeae RNA, TMA: NOT DETECTED
SURESWAB(R) ADV BACTERIAL VAGINOSIS(BV),TMA: POSITIVE — AB
TRICHOMONAS VAGINALIS (TV),TMA: NOT DETECTED

## 2023-10-05 LAB — SURGICAL PATHOLOGY

## 2023-10-06 MED ORDER — METRONIDAZOLE 500 MG PO TABS: 500.0000 mg | ORAL_TABLET | Freq: Two times a day (BID) | ORAL | 0 refills | Status: DC

## 2023-10-06 NOTE — Telephone Encounter (Signed)
 Left a detailed message which is ok per DPR of the swab results and the medication was sent into her pharmacy on file.   Asked pt to call back or send a mychart message if she has any questions or concerns.

## 2023-10-09 ENCOUNTER — Telehealth: Payer: Self-pay | Admitting: *Deleted

## 2023-10-09 MED ORDER — CLINDESSE 2 % VA CREA: 5.0000 g | TOPICAL_CREAM | Freq: Once | VAGINAL | 0 refills | Status: DC

## 2023-10-09 NOTE — Telephone Encounter (Signed)
 Clindess sent to pharmacy, patient notified.   Encounter closed.

## 2023-10-09 NOTE — Telephone Encounter (Signed)
 Spoke with patient. Treated with Flagyl  for BV on 10/06/23. Patient unable to tolerate medication, reports Nausea and vomiting, took 2 doses. Medication list updated.   Requesting alternative.   Dr. Glennon -please advise

## 2023-10-16 ENCOUNTER — Ambulatory Visit: Payer: Self-pay | Admitting: Obstetrics and Gynecology

## 2023-10-16 ENCOUNTER — Encounter: Payer: Self-pay | Admitting: Obstetrics and Gynecology

## 2023-10-25 ENCOUNTER — Telehealth (HOSPITAL_BASED_OUTPATIENT_CLINIC_OR_DEPARTMENT_OTHER): Payer: Self-pay

## 2023-11-02 ENCOUNTER — Ambulatory Visit: Admitting: Obstetrics and Gynecology

## 2023-11-27 ENCOUNTER — Encounter: Payer: Self-pay | Admitting: Obstetrics and Gynecology

## 2023-11-27 ENCOUNTER — Ambulatory Visit (INDEPENDENT_AMBULATORY_CARE_PROVIDER_SITE_OTHER): Admitting: Obstetrics and Gynecology

## 2023-11-27 VITALS — BP 128/80 | HR 94 | Ht 61.0 in | Wt 143.0 lb

## 2023-11-27 DIAGNOSIS — D069 Carcinoma in situ of cervix, unspecified: Secondary | ICD-10-CM | POA: Diagnosis not present

## 2023-11-27 DIAGNOSIS — Z01818 Encounter for other preprocedural examination: Secondary | ICD-10-CM

## 2023-11-27 MED ORDER — OXYCODONE HCL 5 MG PO TABS: 5.0000 mg | ORAL_TABLET | ORAL | 0 refills | Status: DC | PRN

## 2023-11-27 MED ORDER — IBUPROFEN 800 MG PO TABS: 800.0000 mg | ORAL_TABLET | Freq: Three times a day (TID) | ORAL | 1 refills | Status: DC | PRN

## 2023-11-27 MED ORDER — METOCLOPRAMIDE HCL 10 MG PO TABS: 10.0000 mg | ORAL_TABLET | Freq: Three times a day (TID) | ORAL | 0 refills | Status: DC | PRN

## 2023-11-27 NOTE — Progress Notes (Signed)
 Preop for RLH, BSO, cystoscopy, removal of left arm nexplanon   53 y.o. y.o. female here for CINII and discuss surgical options from Destiny C. 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  She is menopausal for several years and denies any PMB Patient also with nexplanon  in left arm and would like to have this removed with surgery. Does report menstrual like cramping  She is trying to cut down on smoking Has arthritis in neck and was given prednisone, but states she can wait until she heals from [facility] to take. She going to start vaginal estrogen for cuff healing Body mass index is 27.02 kg/m.  Blood pressure 128/80, pulse 94, height 5' 1 (1.549 m), weight 143 lb (64.9 kg), SpO2 94%.  No LMP recorded. Patient has had an implant.   MMG: 2025 at [facility] center Colonoscopy: 2024 at [facility]. To get records. Reports normal and repeat in 10 years. Dxa: referral placed Gardesil: none. counseled on importance. Patient to check pricing  US  PELVIS TRANSVAGINAL NON-OB (TV ONLY) (Accession [id number]) (Order [id number]) Imaging Date: 08/15/2023 Department: [facility] Released By: Job Dena RAMAN Authorizing: Chrzanowski, Shasta NOVAK, NP   Exam Status  Status  Final [99]   PACS Intelerad Image Link   Show images for US  PELVIS TRANSVAGINAL NON-OB (TV ONLY) Study Result  Narrative & Impression  Indication: RLQ pain   Vaginal ultrasound   Anteverted uterus normal size and shape 6.06 x 4.06 x 2.45 cm No myometrial masses Free fluid in fundal region   Thin symmetrical endometrium 1.63mm No masses or thickening seen   Both ovaries atrophic in size with normal  perfusion   No adnexal masses   No free fluid   Impression:normal gyn u/s      Result History  US  PELVIS TRANSVAGINAL NON-OB (TV ONLY) (Order #[id number]) on 08/15/2023 - Order Result History Report  MyChart Results Release  Body mass index is 27.02 kg/m.   Blood pressure 128/80, pulse 94, height 5' 1 (1.549 m), weight 143 lb (64.9 kg), SpO2 94%.     Component Value Date/Time   DIAGPAP (A) 07/26/2023 1058    - High grade squamous intraepithelial lesion (HSIL)   DIAGPAP (A) 07/06/2021 1138    - Atypical squamous cells of undetermined significance (ASC-US )   HPVHIGH Positive (A) 07/26/2023 1058   HPVHIGH Positive (A) 07/06/2021 1138   ADEQPAP  07/26/2023 1058    Satisfactory for evaluation; transformation zone component PRESENT.   ADEQPAP  07/06/2021 1138    Satisfactory for evaluation; transformation zone component PRESENT.    GYN HISTORY:    Component Value Date/Time   DIAGPAP (A) 07/26/2023 1058    - High grade squamous intraepithelial lesion (HSIL)   DIAGPAP (A) 07/06/2021 1138    - Atypical squamous cells of undetermined significance (ASC-US )   HPVHIGH Positive (A) 07/26/2023 1058   HPVHIGH Positive (A) 07/06/2021 1138   ADEQPAP  07/26/2023 1058    Satisfactory for evaluation; transformation zone component PRESENT.   ADEQPAP  07/06/2021 1138    Satisfactory for evaluation; transformation zone component PRESENT.    OB History  Gravida Para Term Preterm AB Living  4  3 3  1 3   SAB IAB Ectopic Multiple Live Births  1        # Outcome Date GA Lbr Len/2nd Weight Sex Type Anes PTL Lv  4 Term           3 Term           2 Term           1 SAB             Past Medical History:  Diagnosis Date   Anemia    Hypertension    STD (sexually transmitted disease)    Substance abuse (HCC)     Past Surgical History:  Procedure Laterality Date   KNEE ARTHROSCOPY     patient was 35    Current Outpatient Medications on File Prior to Visit  Medication Sig  Dispense Refill   Cholecalciferol (VITAMIN D3) 25 MCG (1000 UT) CAPS Take 1 capsule by mouth daily.     clindamycin (CLEOCIN T) 1 % lotion  (Patient taking differently: as needed.)     CLINDESSE  vaginal cream SMARTSIG:5 Gram(s) Topical Once     ergocalciferol (VITAMIN D2) 1.25 MG (50000 UT) capsule Vitamin D2 1,250 mcg (50,000 unit) capsule     estradiol  (ESTRACE  VAGINAL) 0.1 MG/GM vaginal cream Rub pea size amount each night for 3 weeks then 3 times a week thereafter. 42.5 g 12   etonogestrel  (NEXPLANON ) 68 MG IMPL implant Inserted 07/22/21 LEFT arm     fluconazole (DIFLUCAN) 150 MG tablet fluconazole 150 mg tablet (Patient taking differently: as needed.)     losartan (COZAAR) 50 MG tablet Take 50 mg by mouth daily.     losartan-hydrochlorothiazide (HYZAAR) 100-25 MG tablet Take 1 tablet by mouth daily.     methocarbamol (ROBAXIN) 500 MG tablet Take 500-1,000 mg by mouth every 6 (six) hours as needed for muscle spasms.      predniSONE (STERAPRED UNI-PAK 21 TAB) 10 MG (21) TBPK tablet SMARTSIG:- Tablet(s) By Mouth -     amLODipine (NORVASC) 10 MG tablet Take 10 mg by mouth daily. (Patient not taking: Reported on 11/27/2023)     No current facility-administered medications on file prior to visit.    Social History   Socioeconomic History   Marital status: Married    Spouse name: Not on file   Number of children: Not on file   Years of education: Not on file   Highest education level: Not on file  Occupational History   Not on file  Tobacco Use   Smoking status: Every Day    Current packs/day: 1.00    Types: Cigarettes    Passive exposure: Current   Smokeless tobacco: Never  Vaping Use   Vaping status: Some Days  Substance and Sexual Activity   Alcohol use: Yes    Alcohol/week: 5.0 standard drinks of alcohol    Types: 5 Shots of liquor per week    Comment: 350 ml   Drug use: Yes    Frequency: 7.0 times per week    Types: Marijuana, Cocaine   Sexual activity: Yes    Birth  control/protection: Implant    Comment: Mens. 50yr sexual debut 53yrsold  Other Topics Concern   Not on file  Social History Narrative   Not on file   Social Drivers of Health   Financial Resource Strain: Not on file  Food Insecurity: Not on file  Transportation Needs: Not on file  Physical Activity: Not on file  Stress: Not on  file  Social Connections: Not on file  Intimate Partner Violence: Not on file    Family History  Problem Relation Age of Onset   COPD Mother    Hypertension Father    Heart disease Father    Parkinson's disease Father    Breast cancer Sister 98   Hypertension Sister    Hypertension Brother    Breast cancer Maternal Aunt 90   Breast cancer Half-Sister 46     Allergies  Allergen Reactions   Penicillins Anaphylaxis    Other Reaction(s): Unknown   Erythromycin Hives   Flagyl  [Metronidazole ] Nausea And Vomiting      Patient's last menstrual period was No LMP recorded. Patient has had an implant..            Review of Systems Alls systems reviewed and are negative.     s: Edited Result - FINAL     Next appt: None     Dx: CIN III (cervical intraepithelial neo...   Test Result Released: No (inaccessible in MyChart)   3 Result Notes     View Follow-Up Encounter    Component Ref Range & Units (hover) 1 mo ago  SURGICAL PATHOLOGY SURGICAL PATHOLOGY CASE: [id number] PATIENT: Destiny Travis Surgical Pathology Report     Clinical History: CIN 3, smoker, pre-op exam, please evaluate for any precancerous or cancerous endometrial cells prior to surger (cm)     FINAL MICROSCOPIC DIAGNOSIS:  A. ENDOMETRIUM, BIOPSY:      Benign inactive endometrium.      Negative for hyperplasia, atypia or malignancy.   GROSS DESCRIPTION:  Received in formalin is blood tinged mucus that is entirely submitted in one block.  Volume: 1.7 x 1.6 x 0.3 cm (1 B) (KW, 10/04/2023)      A:        Longstanding abnormal pap smears CINII Positive  ECC Pelvic cramping Smoker CHTN Preop for RLH/BSO/cysto with nexplanon  removal    Recent bv treated                         P:        The risk, benefits, alternatives of the procedure were discussed with patient including but not limited to the risk for bleeding, infection, injury to surrounding structures such as the bowel, vessels, bladder, and ureters were reviewed.  The risk for blood products and risk for an additional procedure, and risk for laparotomy in an extreme event was discussed.  The risk for blood clots are also discussed.  Postoperative care instructions were discussed and reviewed with patient.  Postoperative medications were sent to her pharmacy and patient was instructed to pick up prior to surgery.  She will have a driver to take her there and take her home and she agrees that that person will stay with her overnight. Post care instructions were also put in the wrap-up section of this note for patient, as a reminder of care instructions. All questions were answered and patient agrees and would like to proceed with the procedure.  30 minutes spent on reviewing records, imaging,  and one on one patient time and counseling patient and documentation Dr. Glennon  No follow-ups on file.  Almarie MARLA Glennon

## 2023-11-27 NOTE — H&P (View-Only) (Signed)
 Preop for RLH, BSO, cystoscopy, removal of left arm nexplanon   53 y.o. y.o. 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  She is menopausal for several years and denies any PMB Patient also with nexplanon  in left arm and would like to have this removed with surgery. Does report menstrual like cramping  She is trying to cut down on smoking Has arthritis in neck and was given prednisone, but states she can wait until she heals from [facility] to take. She going to start vaginal estrogen for cuff healing Body mass index is 27.02 kg/m.  Blood pressure 128/80, pulse 94, height 5' 1 (1.549 m), weight 143 lb (64.9 kg), SpO2 94%.  No LMP recorded. Patient has had an implant.   MMG: 2025 at breast center Colonoscopy: 2024 at [facility]. To get records. Reports normal and repeat in 10 years. Dxa: referral placed Gardesil: none. counseled on importance. Patient to check pricing  US  PELVIS TRANSVAGINAL NON-OB (TV ONLY) (Accession [id number]) (Order [id number]) Imaging Date: 08/15/2023 Department: [facility] Released By: Job Dena RAMAN Authorizing: Chrzanowski, Shasta NOVAK, NP   Exam Status  Status  Final [99]   PACS Intelerad Image Link   Show images for US  PELVIS TRANSVAGINAL NON-OB (TV ONLY) Study Result  Narrative & Impression  Indication: RLQ pain   Vaginal ultrasound   Anteverted uterus normal size and shape 6.06 x 4.06 x 2.45 cm No myometrial masses Free fluid in fundal region   Thin symmetrical endometrium 1.63mm No masses or thickening seen   Both ovaries atrophic in size with normal  perfusion   No adnexal masses   No free fluid   Impression:normal gyn u/s      Result History  US  PELVIS TRANSVAGINAL NON-OB (TV ONLY) (Order #[id number]) on 08/15/2023 - Order Result History Report  MyChart Results Release  Body mass index is 27.02 kg/m.   Blood pressure 128/80, pulse 94, height 5' 1 (1.549 m), weight 143 lb (64.9 kg), SpO2 94%.     Component Value Date/Time   DIAGPAP (A) 07/26/2023 1058    - High grade squamous intraepithelial lesion (HSIL)   DIAGPAP (A) 07/06/2021 1138    - Atypical squamous cells of undetermined significance (ASC-US )   HPVHIGH Positive (A) 07/26/2023 1058   HPVHIGH Positive (A) 07/06/2021 1138   ADEQPAP  07/26/2023 1058    Satisfactory for evaluation; transformation zone component PRESENT.   ADEQPAP  07/06/2021 1138    Satisfactory for evaluation; transformation zone component PRESENT.    GYN HISTORY:    Component Value Date/Time   DIAGPAP (A) 07/26/2023 1058    - High grade squamous intraepithelial lesion (HSIL)   DIAGPAP (A) 07/06/2021 1138    - Atypical squamous cells of undetermined significance (ASC-US )   HPVHIGH Positive (A) 07/26/2023 1058   HPVHIGH Positive (A) 07/06/2021 1138   ADEQPAP  07/26/2023 1058    Satisfactory for evaluation; transformation zone component PRESENT.   ADEQPAP  07/06/2021 1138    Satisfactory for evaluation; transformation zone component PRESENT.    OB History  Gravida Para Term Preterm AB Living  4  3 3  1 3   SAB IAB Ectopic Multiple Live Births  1        # Outcome Date GA Lbr Len/2nd Weight Sex Type Anes PTL Lv  4 Term           3 Term           2 Term           1 SAB             Past Medical History:  Diagnosis Date   Anemia    Hypertension    STD (sexually transmitted disease)    Substance abuse (HCC)     Past Surgical History:  Procedure Laterality Date   KNEE ARTHROSCOPY     patient was 35    Current Outpatient Medications on File Prior to Visit  Medication Sig  Dispense Refill   Cholecalciferol (VITAMIN D3) 25 MCG (1000 UT) CAPS Take 1 capsule by mouth daily.     clindamycin (CLEOCIN T) 1 % lotion  (Patient taking differently: as needed.)     CLINDESSE  vaginal cream SMARTSIG:5 Gram(s) Topical Once     ergocalciferol (VITAMIN D2) 1.25 MG (50000 UT) capsule Vitamin D2 1,250 mcg (50,000 unit) capsule     estradiol  (ESTRACE  VAGINAL) 0.1 MG/GM vaginal cream Rub pea size amount each night for 3 weeks then 3 times a week thereafter. 42.5 g 12   etonogestrel  (NEXPLANON ) 68 MG IMPL implant Inserted 07/22/21 LEFT arm     fluconazole (DIFLUCAN) 150 MG tablet fluconazole 150 mg tablet (Patient taking differently: as needed.)     losartan (COZAAR) 50 MG tablet Take 50 mg by mouth daily.     losartan-hydrochlorothiazide (HYZAAR) 100-25 MG tablet Take 1 tablet by mouth daily.     methocarbamol (ROBAXIN) 500 MG tablet Take 500-1,000 mg by mouth every 6 (six) hours as needed for muscle spasms.      predniSONE (STERAPRED UNI-PAK 21 TAB) 10 MG (21) TBPK tablet SMARTSIG:- Tablet(s) By Mouth -     amLODipine (NORVASC) 10 MG tablet Take 10 mg by mouth daily. (Patient not taking: Reported on 11/27/2023)     No current facility-administered medications on file prior to visit.    Social History   Socioeconomic History   Marital status: Married    Spouse name: Not on file   Number of children: Not on file   Years of education: Not on file   Highest education level: Not on file  Occupational History   Not on file  Tobacco Use   Smoking status: Every Day    Current packs/day: 1.00    Types: Cigarettes    Passive exposure: Current   Smokeless tobacco: Never  Vaping Use   Vaping status: Some Days  Substance and Sexual Activity   Alcohol use: Yes    Alcohol/week: 5.0 standard drinks of alcohol    Types: 5 Shots of liquor per week    Comment: 350 ml   Drug use: Yes    Frequency: 7.0 times per week    Types: Marijuana, Cocaine   Sexual activity: Yes    Birth  control/protection: Implant    Comment: Mens. 52yr sexual debut 53yrsold  Other Topics Concern   Not on file  Social History Narrative   Not on file   Social Drivers of Health   Financial Resource Strain: Not on file  Food Insecurity: Not on file  Transportation Needs: Not on file  Physical Activity: Not on file  Stress: Not on  file  Social Connections: Not on file  Intimate Partner Violence: Not on file    Family History  Problem Relation Age of Onset   COPD Mother    Hypertension Father    Heart disease Father    Parkinson's disease Father    Breast cancer Sister 98   Hypertension Sister    Hypertension Brother    Breast cancer Maternal Aunt 90   Breast cancer Half-Sister 46     Allergies  Allergen Reactions   Penicillins Anaphylaxis    Other Reaction(s): Unknown   Erythromycin Hives   Flagyl  [Metronidazole ] Nausea And Vomiting      Patient's last menstrual period was No LMP recorded. Patient has had an implant..            Review of Systems Alls systems reviewed and are negative.     s: Edited Result - FINAL     Next appt: None     Dx: CIN III (cervical intraepithelial neo...   Test Result Released: No (inaccessible in MyChart)   3 Result Notes     View Follow-Up Encounter    Component Ref Range & Units (hover) 1 mo ago  SURGICAL PATHOLOGY SURGICAL PATHOLOGY CASE: [id number] PATIENT: Ryleigh Ozturk Surgical Pathology Report     Clinical History: CIN 3, smoker, pre-op exam, please evaluate for any precancerous or cancerous endometrial cells prior to surger (cm)     FINAL MICROSCOPIC DIAGNOSIS:  A. ENDOMETRIUM, BIOPSY:      Benign inactive endometrium.      Negative for hyperplasia, atypia or malignancy.   GROSS DESCRIPTION:  Received in formalin is blood tinged mucus that is entirely submitted in one block.  Volume: 1.7 x 1.6 x 0.3 cm (1 B) (KW, 10/04/2023)      A:        Longstanding abnormal pap smears CINII Positive  ECC Pelvic cramping Smoker CHTN Preop for RLH/BSO/cysto with nexplanon  removal    Recent bv treated                         P:        The risk, benefits, alternatives of the procedure were discussed with patient including but not limited to the risk for bleeding, infection, injury to surrounding structures such as the bowel, vessels, bladder, and ureters were reviewed.  The risk for blood products and risk for an additional procedure, and risk for laparotomy in an extreme event was discussed.  The risk for blood clots are also discussed.  Postoperative care instructions were discussed and reviewed with patient.  Postoperative medications were sent to her pharmacy and patient was instructed to pick up prior to surgery.  She will have a driver to take her there and take her home and she agrees that that person will stay with her overnight. Post care instructions were also put in the wrap-up section of this note for patient, as a reminder of care instructions. All questions were answered and patient agrees and would like to proceed with the procedure.  30 minutes spent on reviewing records, imaging,  and one on one patient time and counseling patient and documentation Dr. Glennon  No follow-ups on file.  Almarie MARLA Glennon

## 2023-12-06 ENCOUNTER — Encounter (HOSPITAL_COMMUNITY): Payer: Self-pay | Admitting: Obstetrics and Gynecology

## 2023-12-07 ENCOUNTER — Encounter (HOSPITAL_COMMUNITY): Payer: Self-pay | Admitting: *Deleted

## 2023-12-07 NOTE — Progress Notes (Addendum)
 Surgical Instructions  Your procedure is scheduled on :   Wednesday,  12-20-2023 Report to [facility] Main Entrance A at 11:00 AM, then check in the Admitting office. Any questions or running late day of surgery :  call [phone]  Questions prior to your surgery day:  call [phone], Monday -- Friday 8am - 4pm. If you experience any cold or flu symptoms such as cough, fever, chills, shortness of breath, etc. between now and you scheduled surgery, please notify your surgeon office.   Remember: Do not eat any food after midnight the night before surgery. You may have clear liquids from midnight night before surgery until 10:00 AM.   Clear liquids allowed are:  Water             Carbonated Beverages (diabetics choose diet or no sugar options)  Clear Tea ( no milk, no honey, etc.)  Black coffee ( NO MILK, CREAM OR POWDERED CREAMER OF ANY KIND)  Sport drinks, like Gatorade (diabetes choose diet or no sugar options)  NO clear liquid after 10:00 AM day of surgery.  This includes No water,  candy,  gum, and mints.  Take these medicines the morning of surgery with A SIPS OF WATER:   NONE   May take these medicines IF NEEDED:   Methocarbamol (robaxin) Fluticasone-umeclidin-vilant (trelegy ellipta) Albuterol (ventolin) Systane eye drops  ~~ Please bring both of your inhalers with you day of surgery    One week prior to surgery, STOP taking any Aspirin (unless otherwise instructed by your surgeon) Aleve, Naproxen, ibuprofen, Motrin, Advil, Goody's, BC's, all herbal medications/ supplements, fish oil, and non-prescription vitamins.  Do NOT Smoke (tobacco/ marijuana/ vaping) and Do Not drink alcohol for 24 hours prior to your procedure.  For those patients that use a CPAP.  Please bring your CPAP/ mask/ tubing with them day of surgery . Anesthesia may ask recovery room nurse to use and if you stay the night you be asked to use it.  You will be asked to removed any contacts, glasses,  piercing's, hearing aid's, dentures/ partials prior to surgery.  Please bring cases/ container/ solution/ etc., for them day of surgery.   Patients discharged the day of surgery will NOT be allowed to drive home.  You must have responsible driver and caregiver to stay at home with you the next 24 hours.  SURGICAL WAITING ROOM VISITATION Patients may have no more than 2 support people in the waiting area - if more than 2 , these visitors may rotate.  Pre-op nurse will coordinate an appropriate time for 1 Adult support person, who may not rotate, to accompany patient in pre-op.  Aware some patients may have certain circumstances, speak to pre-op nurse day of surgery.  Children under the age 56 must have an adult with them who is not the patient and must remain in the main waiting area with an adult.  If the patient needs to stay at the hospital during part of their recovery, the visitor guidelines for inpatient rooms apply.  Please refer to the [facility] website for the visitor guidelines for any additional information.  If you received a COVID test during your pre-op visit it is requested that you wear a mask when out in public, stay away from anyone that may not be feeling well and notify your surgeon if you develop symptoms.  If you have been in contact with anyone that has tested positive in the past 10 days notify your surgeon.  [facility] - Preparing for Surgery  Before surgery, you can play an important role. Because skin is not sterile, it needs to be as free of germs as possible. You can reduce the number of germs on your skin by washing with CHG (chlorhexidine gluconate) soap before surgery. CHG is an antiseptic cleaner which kills germs and bonds with the skin to continue killing germs even after washing. Oral hygiene is also important in reducing the risk of infection. Remember to brush your teeth with your regular toothpaste the morning of surgery.  Please DO NOT use if you have  an allergy to CHG or antibacterial soaps. If your skin becomes reddened/irritated stop using the CHG and inform your Pre-op nurse day of surgery.  DO NOT shave (including legs and genital area) for at least 48 hours prior to your CHG shower.   Please follow these instructions carefully:  Shower with CHG soap the night before surgery. If you choose to wash your hair, wash your hair first as usual with your normal shampoo. After you shampoo, rinse your hair and body thoroughly to remove the shampoo. Use CHG as you would any other liquid soap. You can apply CHG directly to the skin and wash gently with a clean washcloth or shower sponge. Apply the CHG soap to your body ONLY FROM THE NECK DOWN. Do not use on open wounds or open sores. Avoid contact with your eyes, ears, mouth, and genitals (private parts). Wash genitals (private parts) with your normal soap. Wash thoroughly, paying special attention to the area where your surgery will be performed. Thoroughly rinse your body with warm water from the neck down. DO NOT shower/wash with your normal soap after using and rinsing off the CHG soap. DO NOT use lotions, oils, etc., after showering with CHG. Pat yourself dry with a clean towel. Wear clean pajamas. Place clean sheets on your bed the night of your CHG shower and do not sleep with pets.  Day of Surgery  DO NOT Apply any lotions,  powder,  oils,  deodorants (may use underarm deodorant),  cologne/  perfumes  or makeup Do Not wear jewelry /  piercing's/  metal/  permanent jewelry must be removed prior to arrival day of surgery. (No plastic piercing) Do Not wear nail polish,  gel polish,  artificial nails, or any other type of covering on natural finger nails (toe nails are okay) Remember to brush your teeth and rinse mouth out. Put on clean / comfortable clothes. [facility] is not responsible for valuables/ personal belongings

## 2023-12-07 NOTE — Progress Notes (Addendum)
 Addendum:  Actual weight done today at lab appointment, 140.6 lb   Spoke w/ via phone for pre-op interview--- pt Lab needs dos----   no      Lab results------ lab appt 12-18-2023 @ 1300 getting CBC/ BMP/ T&S/ EKG/ HIV antibody/ HIV 4G save tube COVID test -----patient states asymptomatic no test needed Arrive at -------  1100 on 12-20-2023 NPO after MN NO Solid Food.  Clear liquids from MN until--- 1000 Pre-Surgery Ensure or G2: n/a  Med rec completed Medications to take morning of surgery ----- if needed take robaxin, trelegy/ albuterol inhaler (asked to bring both inhaler dos) Diabetic medication ----- n/a  GLP1 agonist last dose: n/a GLP1 instructions:  Patient instructed no nail polish to be worn day of surgery Patient instructed to bring photo id and insurance card day of surgery Patient aware to have Driver (ride ) / caregiver    for 24 hours after surgery - husband, justin Jeffcoat Patient Special Instructions ----- will pick up soap and written instructions at lab appt Pre-Op special Instructions ----- n/a  Patient verbalized understanding of instructions that were given at this phone interview. Patient denies chest pain, sob, fever, cough at the interview.

## 2023-12-18 ENCOUNTER — Encounter (HOSPITAL_COMMUNITY)
Admission: RE | Admit: 2023-12-18 | Discharge: 2023-12-18 | Disposition: A | Source: Ambulatory Visit | Attending: Obstetrics and Gynecology

## 2023-12-18 ENCOUNTER — Ambulatory Visit: Payer: Self-pay | Admitting: Obstetrics and Gynecology

## 2023-12-18 DIAGNOSIS — Z01818 Encounter for other preprocedural examination: Secondary | ICD-10-CM | POA: Diagnosis present

## 2023-12-18 LAB — CBC
HCT: 43.8 % (ref 36.0–46.0)
Hemoglobin: 14.9 g/dL (ref 12.0–15.0)
MCH: 33.6 pg (ref 26.0–34.0)
MCHC: 34 g/dL (ref 30.0–36.0)
MCV: 98.9 fL (ref 80.0–100.0)
Platelets: 261 K/uL (ref 150–400)
RBC: 4.43 MIL/uL (ref 3.87–5.11)
RDW: 12.2 % (ref 11.5–15.5)
WBC: 7.1 K/uL (ref 4.0–10.5)
nRBC: 0 % (ref 0.0–0.2)

## 2023-12-18 LAB — BASIC METABOLIC PANEL WITH GFR
Anion gap: 10 (ref 5–15)
BUN: 10 mg/dL (ref 6–20)
CO2: 25 mmol/L (ref 22–32)
Calcium: 9.6 mg/dL (ref 8.9–10.3)
Chloride: 106 mmol/L (ref 98–111)
Creatinine, Ser: 0.78 mg/dL (ref 0.44–1.00)
GFR, Estimated: >60 mL/min (ref >60)
Glucose, Bld: 120 mg/dL — ABNORMAL HIGH (ref 70–99)
Potassium: 3.9 mmol/L (ref 3.5–5.1)
Sodium: 141 mmol/L (ref 135–145)

## 2023-12-18 LAB — TYPE AND SCREEN
ABO/RH(D): A POS
Antibody Screen: NEGATIVE

## 2023-12-18 LAB — HIV ANTIBODY (ROUTINE TESTING W REFLEX): HIV Screen 4th Generation wRfx: NONREACTIVE

## 2023-12-19 MED ORDER — CLINDAMYCIN PHOSPHATE 900 MG/50ML IV SOLN
900.0000 mg | INTRAVENOUS | Status: AC
  Administered 2023-12-20: 900 mg via INTRAVENOUS
  Filled 2023-12-19: qty 50

## 2023-12-19 MED ORDER — GENTAMICIN SULFATE 40 MG/ML IJ SOLN
5.0000 mg/kg | INTRAVENOUS | Status: AC
  Administered 2023-12-20: 319.2 mg via INTRAVENOUS
  Filled 2023-12-19: qty 8

## 2023-12-19 MED ORDER — SODIUM CHLORIDE 0.9 % IV SOLN
INTRAVENOUS | Status: AC
  Filled 2023-12-19: qty 10

## 2023-12-20 ENCOUNTER — Encounter (HOSPITAL_COMMUNITY)
Admission: RE | Disposition: A | Discharge status: Home / Self Care | Payer: Self-pay | Source: Home / Self Care | Attending: Obstetrics and Gynecology

## 2023-12-20 ENCOUNTER — Other Ambulatory Visit: Payer: Self-pay

## 2023-12-20 ENCOUNTER — Ambulatory Visit (HOSPITAL_COMMUNITY)

## 2023-12-20 ENCOUNTER — Ambulatory Visit (HOSPITAL_COMMUNITY)
Admission: RE | Admit: 2023-12-20 | Discharge: 2023-12-20 | Disposition: A | Discharge status: Home / Self Care | Attending: Obstetrics and Gynecology | Admitting: Obstetrics and Gynecology

## 2023-12-20 ENCOUNTER — Encounter (HOSPITAL_COMMUNITY): Payer: Self-pay | Admitting: Obstetrics and Gynecology

## 2023-12-20 DIAGNOSIS — M47812 Spondylosis without myelopathy or radiculopathy, cervical region: Secondary | ICD-10-CM | POA: Insufficient documentation

## 2023-12-20 DIAGNOSIS — F17210 Nicotine dependence, cigarettes, uncomplicated: Secondary | ICD-10-CM | POA: Insufficient documentation

## 2023-12-20 DIAGNOSIS — D069 Carcinoma in situ of cervix, unspecified: Secondary | ICD-10-CM | POA: Diagnosis not present

## 2023-12-20 DIAGNOSIS — I10 Essential (primary) hypertension: Secondary | ICD-10-CM | POA: Insufficient documentation

## 2023-12-20 DIAGNOSIS — Z9889 Other specified postprocedural states: Secondary | ICD-10-CM

## 2023-12-20 DIAGNOSIS — Z01818 Encounter for other preprocedural examination: Secondary | ICD-10-CM

## 2023-12-20 DIAGNOSIS — N72 Inflammatory disease of cervix uteri: Secondary | ICD-10-CM | POA: Insufficient documentation

## 2023-12-20 DIAGNOSIS — B977 Papillomavirus as the cause of diseases classified elsewhere: Secondary | ICD-10-CM | POA: Insufficient documentation

## 2023-12-20 HISTORY — PX: ROBOTIC ASSISTED TOTAL HYSTERECTOMY WITH BILATERAL SALPINGO OOPHERECTOMY: SHX6086

## 2023-12-20 HISTORY — PX: REMOVAL OF NON VAGINAL CONTRACEPTIVE DEVICE: SHX6219

## 2023-12-20 HISTORY — DX: Presence of spectacles and contact lenses: Z97.3

## 2023-12-20 HISTORY — DX: Excessive and frequent menstruation with regular cycle: N92.0

## 2023-12-20 HISTORY — DX: Alcohol abuse, uncomplicated: F10.10

## 2023-12-20 HISTORY — DX: Unspecified asthma, uncomplicated: J45.909

## 2023-12-20 HISTORY — PX: CYSTOSCOPY: SHX5120

## 2023-12-20 HISTORY — DX: Presence of dental prosthetic device (complete) (partial): Z97.2

## 2023-12-20 HISTORY — DX: Dysmenorrhea, unspecified: N94.6

## 2023-12-20 HISTORY — DX: Carcinoma in situ of cervix, unspecified: D06.9

## 2023-12-20 HISTORY — DX: Other cervical disc degeneration, unspecified cervical region: M50.30

## 2023-12-20 HISTORY — DX: Personal history of gestational diabetes: Z86.32

## 2023-12-20 LAB — ABO/RH: ABO/RH(D): A POS

## 2023-12-20 SURGERY — REMOVAL, CONTRACEPTIVE DEVICE, NON-VAGINAL
Anesthesia: General

## 2023-12-20 MED ORDER — PROPOFOL 10 MG/ML IV BOLUS
INTRAVENOUS | Status: AC
  Filled 2023-12-20: qty 20

## 2023-12-20 MED ORDER — ORAL CARE MOUTH RINSE: 15.0000 mL | Freq: Once | OROMUCOSAL | Status: AC

## 2023-12-20 MED ORDER — MIDAZOLAM HCL 2 MG/2ML IJ SOLN
INTRAMUSCULAR | Status: AC
  Filled 2023-12-20: qty 2

## 2023-12-20 MED ORDER — SUGAMMADEX SODIUM 200 MG/2ML IV SOLN
INTRAVENOUS | Status: DC | PRN
  Administered 2023-12-20: 200 mg via INTRAVENOUS

## 2023-12-20 MED ORDER — HEMOSTATIC AGENTS (NO CHARGE) OPTIME
TOPICAL | Status: DC | PRN
  Administered 2023-12-20: 1 via TOPICAL

## 2023-12-20 MED ORDER — MIDAZOLAM HCL (PF) 2 MG/2ML IJ SOLN
INTRAMUSCULAR | Status: DC | PRN
  Administered 2023-12-20: 2 mg via INTRAVENOUS

## 2023-12-20 MED ORDER — KETOROLAC TROMETHAMINE 30 MG/ML IJ SOLN
INTRAMUSCULAR | Status: AC
  Filled 2023-12-20: qty 1

## 2023-12-20 MED ORDER — ACETAMINOPHEN 10 MG/ML IV SOLN: 1000.0000 mg | Freq: Once | INTRAVENOUS | Status: DC | PRN

## 2023-12-20 MED ORDER — LACTATED RINGERS IV SOLN: INTRAVENOUS | Status: DC

## 2023-12-20 MED ORDER — DEXAMETHASONE SOD PHOSPHATE PF 10 MG/ML IJ SOLN
INTRAMUSCULAR | Status: DC | PRN
  Administered 2023-12-20: 10 mg via INTRAVENOUS

## 2023-12-20 MED ORDER — OXYCODONE HCL 5 MG PO TABS: 5.0000 mg | ORAL_TABLET | Freq: Once | ORAL | Status: AC | PRN

## 2023-12-20 MED ORDER — FENTANYL CITRATE (PF) 250 MCG/5ML IJ SOLN
INTRAMUSCULAR | Status: DC | PRN
  Administered 2023-12-20 (×2): 100 ug via INTRAVENOUS

## 2023-12-20 MED ORDER — METRONIDAZOLE 500 MG/100ML IV SOLN
INTRAVENOUS | Status: DC
  Filled 2023-12-20: qty 100

## 2023-12-20 MED ORDER — PROPOFOL 10 MG/ML IV BOLUS
INTRAVENOUS | Status: DC | PRN
  Administered 2023-12-20: 170 mg via INTRAVENOUS
  Administered 2023-12-20: 30 mg via INTRAVENOUS

## 2023-12-20 MED ORDER — CHLORHEXIDINE GLUCONATE 0.12 % MT SOLN
OROMUCOSAL | Status: AC
  Administered 2023-12-20: 15 mL via OROMUCOSAL
  Filled 2023-12-20: qty 15

## 2023-12-20 MED ORDER — OXYCODONE HCL 5 MG/5ML PO SOLN
5.0000 mg | Freq: Once | ORAL | Status: AC | PRN
  Administered 2023-12-20: 5 mg via ORAL

## 2023-12-20 MED ORDER — METRONIDAZOLE 500 MG/100ML IV SOLN: 500.0000 mg | Freq: Once | INTRAVENOUS | Status: DC

## 2023-12-20 MED ORDER — BUPIVACAINE HCL 0.5 % IJ SOLN
INTRAMUSCULAR | Status: DC | PRN
  Administered 2023-12-20: 25 mL

## 2023-12-20 MED ORDER — KETAMINE HCL 50 MG/5ML IJ SOSY
PREFILLED_SYRINGE | INTRAMUSCULAR | Status: AC
  Filled 2023-12-20: qty 5

## 2023-12-20 MED ORDER — POVIDONE-IODINE 10 % EX SWAB: 2.0000 | Freq: Once | CUTANEOUS | Status: DC

## 2023-12-20 MED ORDER — ONDANSETRON HCL 4 MG/2ML IJ SOLN
INTRAMUSCULAR | Status: AC
  Filled 2023-12-20: qty 2

## 2023-12-20 MED ORDER — FENTANYL CITRATE (PF) 250 MCG/5ML IJ SOLN
INTRAMUSCULAR | Status: AC
  Filled 2023-12-20: qty 5

## 2023-12-20 MED ORDER — ALBUMIN HUMAN 5 % IV SOLN: INTRAVENOUS | Status: DC | PRN

## 2023-12-20 MED ORDER — CLINDAMYCIN PHOSPHATE 900 MG/50ML IV SOLN
INTRAVENOUS | Status: AC
  Filled 2023-12-20: qty 50

## 2023-12-20 MED ORDER — SODIUM CHLORIDE 0.9 % IR SOLN
Status: DC | PRN
  Administered 2023-12-20: 1000 mL

## 2023-12-20 MED ORDER — LIDOCAINE 2% (20 MG/ML) 5 ML SYRINGE
INTRAMUSCULAR | Status: DC | PRN
  Administered 2023-12-20: 60 mg via INTRAVENOUS

## 2023-12-20 MED ORDER — ROCURONIUM BROMIDE 10 MG/ML (PF) SYRINGE
PREFILLED_SYRINGE | INTRAVENOUS | Status: DC | PRN
  Administered 2023-12-20: 50 mg via INTRAVENOUS
  Administered 2023-12-20: 20 mg via INTRAVENOUS

## 2023-12-20 MED ORDER — ACETAMINOPHEN 10 MG/ML IV SOLN
INTRAVENOUS | Status: DC | PRN
  Administered 2023-12-20: 950 mg via INTRAVENOUS

## 2023-12-20 MED ORDER — DEXMEDETOMIDINE HCL IN NACL 80 MCG/20ML IV SOLN
INTRAVENOUS | Status: DC | PRN
  Administered 2023-12-20: 8 ug via INTRAVENOUS

## 2023-12-20 MED ORDER — 0.9 % SODIUM CHLORIDE (POUR BTL) OPTIME
TOPICAL | Status: DC | PRN
  Administered 2023-12-20: 1000 mL

## 2023-12-20 MED ORDER — FENTANYL CITRATE (PF) 100 MCG/2ML IJ SOLN: 25.0000 ug | INTRAMUSCULAR | Status: DC | PRN

## 2023-12-20 MED ORDER — KETOROLAC TROMETHAMINE 30 MG/ML IJ SOLN
INTRAMUSCULAR | Status: DC | PRN
  Administered 2023-12-20: 30 mg via INTRAVENOUS

## 2023-12-20 MED ORDER — ACETAMINOPHEN 10 MG/ML IV SOLN
INTRAVENOUS | Status: AC
  Filled 2023-12-20: qty 100

## 2023-12-20 MED ORDER — LIDOCAINE 2% (20 MG/ML) 5 ML SYRINGE
INTRAMUSCULAR | Status: AC
  Filled 2023-12-20: qty 5

## 2023-12-20 MED ORDER — KETAMINE HCL 50 MG/5ML IJ SOSY
PREFILLED_SYRINGE | INTRAMUSCULAR | Status: DC | PRN
  Administered 2023-12-20: 30 mg via INTRAVENOUS

## 2023-12-20 MED ORDER — DROPERIDOL 2.5 MG/ML IJ SOLN: 0.6250 mg | Freq: Once | INTRAMUSCULAR | Status: DC | PRN

## 2023-12-20 MED ORDER — ROCURONIUM BROMIDE 10 MG/ML (PF) SYRINGE
PREFILLED_SYRINGE | INTRAVENOUS | Status: AC
  Filled 2023-12-20: qty 10

## 2023-12-20 MED ORDER — ONDANSETRON HCL 4 MG/2ML IJ SOLN
INTRAMUSCULAR | Status: DC | PRN
  Administered 2023-12-20: 4 mg via INTRAVENOUS

## 2023-12-20 MED ORDER — OXYCODONE HCL 5 MG/5ML PO SOLN
ORAL | Status: AC
  Filled 2023-12-20: qty 5

## 2023-12-20 MED ORDER — CHLORHEXIDINE GLUCONATE 0.12 % MT SOLN: 15.0000 mL | Freq: Once | OROMUCOSAL | Status: AC

## 2023-12-20 SURGICAL SUPPLY — 53 items
BENZOIN TINCTURE PRP APPL 2/3 (GAUZE/BANDAGES/DRESSINGS) IMPLANT
BLADE SURG 11 STRL SS (BLADE) IMPLANT
BNDG GAUZE DERMACEA FLUFF 4 (GAUZE/BANDAGES/DRESSINGS) IMPLANT
COVER BACK TABLE 60X90IN (DRAPES) ×4 IMPLANT
COVER TIP SHEARS 8 DVNC (MISCELLANEOUS) ×4 IMPLANT
DEFOGGER SCOPE WARM SEASHARP (MISCELLANEOUS) ×4 IMPLANT
DERMABOND ADVANCED .7 DNX12 (GAUZE/BANDAGES/DRESSINGS) ×4 IMPLANT
DRAPE ARM DVNC X/XI (DISPOSABLE) ×16 IMPLANT
DRAPE COLUMN DVNC XI (DISPOSABLE) ×4 IMPLANT
DRAPE SURG IRRIG POUCH 19X23 (DRAPES) ×4 IMPLANT
DRAPE UTILITY XL STRL (DRAPES) ×4 IMPLANT
DRIVER NDL MEGA SUTCUT DVNCXI (INSTRUMENTS) ×4 IMPLANT
DRSG COVADERM PLUS 2X2 (GAUZE/BANDAGES/DRESSINGS) ×4 IMPLANT
DURAPREP 26ML APPLICATOR (WOUND CARE) ×4 IMPLANT
DURAPREP 6ML APPLICATOR 50/CS (WOUND CARE) IMPLANT
ELECTRODE REM PT RTRN 9FT ADLT (ELECTROSURGICAL) ×4 IMPLANT
FORCEPS PROGRASP DVNC XI (FORCEP) ×4 IMPLANT
GAUZE SPONGE 4X4 12PLY STRL (GAUZE/BANDAGES/DRESSINGS) IMPLANT
GLOVE BIOGEL PI IND STRL 7.0 (GLOVE) ×8 IMPLANT
GLOVE NEODERM STER SZ 7 (GLOVE) ×12 IMPLANT
GLOVE NEODERM STRL 7.5 LF PF (GLOVE) IMPLANT
GOWN STRL REUS W/ TWL LRG LVL3 (GOWN DISPOSABLE) ×4 IMPLANT
GOWN STRL SURGICAL XL XLNG (GOWN DISPOSABLE) IMPLANT
HOLDER FOLEY CATH W/STRAP (MISCELLANEOUS) IMPLANT
IRRIGATION SUCT STRKRFLW 2 WTP (MISCELLANEOUS) ×4 IMPLANT
IV 0.9% NACL 1000 ML (IV SOLUTION) IMPLANT
KIT PINK PAD W/HEAD ARM REST (MISCELLANEOUS) ×4 IMPLANT
KIT TURNOVER KIT B (KITS) ×4 IMPLANT
LEGGING LITHOTOMY PAIR STRL (DRAPES) ×4 IMPLANT
MANIFOLD NEPTUNE II (INSTRUMENTS) ×4 IMPLANT
NDL HYPO 25GX1X1/2 BEV (NEEDLE) ×4 IMPLANT
OBTURATOR OPTICALSTD 8 DVNC (TROCAR) ×4 IMPLANT
PACK ROBOT WH (CUSTOM PROCEDURE TRAY) ×4 IMPLANT
PACK ROBOTIC GOWN (GOWN DISPOSABLE) ×4 IMPLANT
PAD OB MATERNITY 11 LF (PERSONAL CARE ITEMS) ×4 IMPLANT
POWDER SURGICEL 3.0 GRAM (HEMOSTASIS) IMPLANT
RUMI II GYRUS 3.5CM BLUE (DISPOSABLE) IMPLANT
SCISSORS MNPLR CVD DVNC XI (INSTRUMENTS) ×4 IMPLANT
SEAL UNIV 5-12 XI (MISCELLANEOUS) ×12 IMPLANT
SEALER VESSEL EXT DVNC XI (MISCELLANEOUS) IMPLANT
SET CYSTO IRRIGATION (SET/KITS/TRAYS/PACK) ×4 IMPLANT
SET TUBE SMOKE EVAC HIGH FLOW (TUBING) ×4 IMPLANT
SHEET LAVH (DRAPES) IMPLANT
SOLN 0.9% NACL POUR BTL 1000ML (IV SOLUTION) ×4 IMPLANT
STRIP CLOSURE SKIN 1/2X4 (GAUZE/BANDAGES/DRESSINGS) IMPLANT
SUT MNCRL AB 4-0 PS2 18 (SUTURE) ×4 IMPLANT
SUT VLOC 180 0 9IN GS21 (SUTURE) ×8 IMPLANT
SYR CONTROL 10ML LL (SYRINGE) ×4 IMPLANT
TIP ENDOSCOPIC SURGICEL (TIP) IMPLANT
TIP UTERINE 6.7X10CM GRN DISP (MISCELLANEOUS) IMPLANT
TOWEL GREEN STERILE (TOWEL DISPOSABLE) ×4 IMPLANT
TRAY FOLEY W/BAG SLVR 14FR (SET/KITS/TRAYS/PACK) ×4 IMPLANT
UNDERPAD 30X36 HEAVY ABSORB (UNDERPADS AND DIAPERS) ×4 IMPLANT

## 2023-12-20 NOTE — Interval H&P Note (Signed)
 History and Physical Interval Note:  12/20/2023 1:26 PM  Destiny Travis  has presented today for surgery, with the diagnosis of CINIII, smoker.  The various methods of treatment have been discussed with the patient and family. After consideration of risks, benefits and other options for treatment, the patient has consented to  Procedure(s) with comments: HYSTERECTOMY, TOTAL, ROBOT-ASSISTED, LAPAROSCOPIC, WITH BILATERAL SALPINGO-OOPHORECTOMY (Bilateral) REMOVAL, CONTRACEPTIVE DEVICE, NON-VAGINAL (Left) - Nexplanon  removal left arm CYSTOSCOPY (N/A) as a surgical intervention.  The patient's history has been reviewed, patient examined, no change in status, stable for surgery.  I have reviewed the patient's chart and labs.  Questions were answered to the patient's satisfaction.     Destiny Travis

## 2023-12-20 NOTE — Op Note (Signed)
 12/20/2023   [id number]  Destiny Travis         OPERATIVE REPORT   Preop Diagnosis: CINIII, smoker, long standing abnormal pap smears desires a definitive management, nexplanon  in place Procedure: robotic hysterectomy, BSO, cystoscopy, removal of nexplanon  from left arm   Surgeon: Dr. Almarie Sauer Devine Klingel Assistant: Judyann Rattler, RN   Circulator: Clarisse, Chanler A Scrub Person: Key, Jackolyn VIRGIN Ly, Maggie, CST; Arbutus Suzen HERO Circulator Assistant: [place], Hadassah Buel RAMAN, RN  Fluids: please see anesthesia report   Complications: None Anesthesia: General     Findings:  8cm uterus, normal ovaries and tubes Cystoscopy at the end of the case with normal bladder and patent ureters bilaterally.   Estimated blood loss: 150cc   Specimens: Uterus, cervix and bilateral tubes and ovaries, nexplanon    Disposition of specimen: Pathology           Patient is taken to the operating room. She is placed in the supine position. She is a running IV in place. Informed consent was present on the chart. SCDs on her lower extremities and functioning properly. Patient was positioned while she was awake.  Her legs were placed in the low lithotomy position in [place] stirrups. Her arms were tucked by the side.  General endotracheal anesthesia was administered by the anesthesia staff without difficulty.       Chlora prep was then used to prep the abdomen and Hibiclens was used to prep the inner thighs, perineum and vagina. Once 3 minutes had past the patient was draped in a normal standard fashion. A proper time out was performed and everyone agreed.  The legs were lifted to the high lithotomy position. A bivalve speculum was inserted into the vagina and the anterior lip of the cervix was grasped with single-tooth tenaculum.  The uterus sounded to  8 cm. Pratt dilators were used to dilate the cervix.  The RUMI uterine manipulator was obtained inserted into the endometrial cavity  and the bulb of the disposable tip was inflated with 8 cc of normal saline. There was a good fit of the KOH ring around the cervix. The tenaculum and bivavle speculum was removed. There is also good manipulation of the uterus.  A Foley catheter was placed to straight drain.  Clear urine was noted. Legs were lowered to the low lithotomy position and attention was turned the abdomen.   Superior to the umbilicus, marcaine 0.25% used to anesthetize the skin.  Using #11 blade, 8mm skin incision was made.  The 8mm robotic trocar and sleeve was inserted under direct visualization.  CO2 gas was  started and patient was placed in trendelenburg position.  Two additional 8mm ports were placed under direct visualization in the left and right lower quadrant.     Ureters were identifies.  Attention was turned to the left side.  The left IP ligament was noted away from the ureters and this was desiccated and transected with the vessel sealer.  The uterine ovarian pedicle was serially clamped cauterized and incised. Left round ligament was serially clamped cauterized and incised. The anterior and posterior peritoneum of the inferior leaf of the broad ligament were opened. The beginning of the bladder flap was created.  The bladder was taken down below the level of the KOH ring. The left uterine artery skeletonized and then just superior to the KOH ring this vessel was serially clamped, cauterized, and incised.   Attention was turned the right side.  The uterus was placed on stretch to the  opposite side.    The right IP ligament was away from the ureter and this was desiccated and transected with the vessel sealer.  Then the right uterine ovarian pedicle was serially clamped cauterized and incised. Next the right round ligament was serially clamped cauterized and incised. The anterior posterior peritoneum of the inferiorly for the broad ligament were opened. The anterior peritoneum was carried across to the dissection on the  left side. The remainder of the bladder flap was created using sharp dissection. The bladder was well below the level of the KOH ring. The right uterine artery skeletonized. Then the right uterine artery, above the level of the KOH ring, was serially clamped cauterized and incised. The uterus was devascularized at this point.   The colpotomy was performed.  This was carried around a circumferential fashion until the vaginal mucosa was completely incised in the specimen was freed.  The specimen was then delivered to the vagina.  A vaginal occlusive device was used to maintain the pneumoperitoneum   Instruments were changed with a needle driver and prograsp.  Using a 9 inch  zero V-lock suture, the cuff was closed by incorporating the anterior and posterior vaginal mucosa in each stitch. This was carried across all the way to the left corner and a running fashion. Two stitches were brought back towards the midline and the suture was cut flush with the vagina. The needle was brought out the pelvis. The pelvis was irrigated. All pedicles were inspected. No bleeding was noted.   CO2 pressures were lowered to 8mm Hg.  Again, no bleeding was noted.  Ureters were noted deep in the pelvis to be peristalsing.  At this point the procedure was completed.  The remaining instruments were removed.  The ports were removed under direct visualization of the laparoscope and the pneumoperitoneum was relieved.   The skin was then closed with subcuticular stitches of 3-0 Vicryl. The skin was cleansed Dermabond was applied. Attention was then turned the vagina and the cuff was inspected. No bleeding was noted.  The Foley catheter was removed.  Cystoscopy was performed.  No sutures or bladder injuries were noted.  Ureters were noted with normal urine jets from each one was seen.  Foley was left out after the cystoscopic fluid was drained and cystoscope removed.  Sponge, lap, needle, instrument counts were correct x2. Patient  tolerated the procedure very well. She was awakened from anesthesia, extubated and taken to recovery in stable condition.     Nexplanon  Removal  Patient identified, informed consent performed, consent signed.  Nexplanon  site identified in left arm.  Area prepped in usual sterile fashon. One ml of 1% lidocaine  was used to anesthetize the area at the distal end of the implant. A small stab incision was made right beside the implant on the distal portion. The Nexplanon  rod was grasped using hemostats and removed without difficulty. There was minimal blood loss. There were no complications. Patient insertion site covered with gauze and a pressure bandage to reduce any bruising.    Dr. Glennon

## 2023-12-20 NOTE — Anesthesia Procedure Notes (Signed)
 Procedure Name: Intubation Date/Time: 12/20/2023 2:49 PM  Performed by: Delores Dus, CRNAPre-anesthesia Checklist: Patient identified, Emergency Drugs available, Suction available and Patient being monitored Patient Re-evaluated:Patient Re-evaluated prior to induction Oxygen Delivery Method: Circle system utilized Preoxygenation: Pre-oxygenation with 100% oxygen Induction Type: IV induction Ventilation: Mask ventilation without difficulty Laryngoscope Size: Miller and 2 Grade View: Grade II Tube type: Oral Tube size: 7.0 mm Number of attempts: 1 Airway Equipment and Method: Stylet and Oral airway Placement Confirmation: ETT inserted through vocal cords under direct vision, positive ETCO2 and breath sounds checked- equal and bilateral Secured at: 21 cm Tube secured with: Tape Dental Injury: Teeth and Oropharynx as per pre-operative assessment
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
## 2023-12-20 NOTE — Anesthesia Postprocedure Evaluation (Signed)
 Anesthesia Post Note  Patient: Destiny Travis  Procedure(s) Performed: REMOVAL, CONTRACEPTIVE DEVICE, NON-VAGINAL (Left) CYSTOSCOPY HYSTERECTOMY, TOTAL, ROBOT-ASSISTED, LAPAROSCOPIC, WITH BILATERAL SALPINGO-OOPHORECTOMY     Patient location during evaluation: PACU Anesthesia Type: General Level of consciousness: awake and alert, oriented and patient cooperative Pain management: pain level controlled Vital Signs Assessment: post-procedure vital signs reviewed and stable Respiratory status: spontaneous breathing, nonlabored ventilation and respiratory function stable Cardiovascular status: blood pressure returned to baseline and stable Postop Assessment: no apparent nausea or vomiting Anesthetic complications: no   No notable events documented.  Last Vitals:  Vitals:   12/20/23 1700 12/20/23 1715  BP: (!) 147/80 (!) 151/82  Pulse: 60 61  Resp: 13 13  Temp:    SpO2: 100% 100%    Last Pain:  Vitals:   12/20/23 1715  TempSrc:   PainSc: Asleep   Pain Goal: Patients Stated Pain Goal: 7 (12/20/23 1104)                 [name]

## 2023-12-20 NOTE — Transfer of Care (Signed)
 Immediate Anesthesia Transfer of Care Note  Patient: Destiny Travis  Procedure(s) Performed: REMOVAL, CONTRACEPTIVE DEVICE, NON-VAGINAL (Left) CYSTOSCOPY HYSTERECTOMY, TOTAL, ROBOT-ASSISTED, LAPAROSCOPIC, WITH BILATERAL SALPINGO-OOPHORECTOMY  Patient Location: PACU  Anesthesia Type:General  Level of Consciousness: awake and responds to stimulation  Airway & Oxygen Therapy: Patient Spontanous Breathing and Patient connected to face mask oxygen  Post-op Assessment: Report given to RN, Post -op Vital signs reviewed and stable, and Patient moving all extremities X 4  Post vital signs: Reviewed and stable  Last Vitals:  Vitals Value Taken Time  BP    Temp    Pulse    Resp    SpO2      Last Pain:  Vitals:   12/20/23 1104  TempSrc: Oral  PainSc: 0-No pain      Patients Stated Pain Goal: 7 (12/20/23 1104)  Complications: No notable events documented.

## 2023-12-21 ENCOUNTER — Encounter (HOSPITAL_COMMUNITY): Payer: Self-pay | Admitting: Obstetrics and Gynecology

## 2023-12-21 ENCOUNTER — Telehealth: Payer: Self-pay | Admitting: Obstetrics and Gynecology

## 2023-12-21 NOTE — Telephone Encounter (Signed)
 Patient called for surgery follow-up.  She is doing well with no concerns Dr. Glennon

## 2023-12-22 ENCOUNTER — Ambulatory Visit: Payer: Self-pay | Admitting: Obstetrics and Gynecology

## 2023-12-22 LAB — SURGICAL PATHOLOGY

## 2023-12-27 ENCOUNTER — Encounter (HOSPITAL_COMMUNITY): Payer: Self-pay | Admitting: Obstetrics and Gynecology

## 2024-01-03 ENCOUNTER — Ambulatory Visit: Admitting: Obstetrics and Gynecology

## 2024-01-03 ENCOUNTER — Encounter: Payer: Self-pay | Admitting: Obstetrics and Gynecology

## 2024-01-03 VITALS — BP 138/82 | HR 68 | Ht 62.0 in | Wt 139.0 lb

## 2024-01-03 DIAGNOSIS — Z09 Encounter for follow-up examination after completed treatment for conditions other than malignant neoplasm: Secondary | ICD-10-CM

## 2024-01-03 NOTE — Progress Notes (Signed)
 Post op Very sore on right side Very red, itchy rash after surgery

## 2024-01-03 NOTE — Progress Notes (Signed)
 Patient presents for 2 week postop from [facility], bilateral salpingectomy, cystoscopy, removal of nexplanon . She is doing well. No fevers, VB, dysuria or severe abdominal pain. Did have a rash from dura prep but it is better now. RLQ tender at times  BP 138/82 (BP Location: Left Arm, Patient Position: Sitting, Cuff Size: Normal)   Pulse 68   Ht 5' 2 (1.575 m)   Wt 139 lb (63 kg)   SpO2 96%   BMI 25.42 kg/m   Abdomen: incisions I/c/d, NT, ND  A/p PO from [facility] 2 weeks doing well Encouraged no heavy lifting, pushing, pulling greater than 13 lbs for full 6 weeks 2. Pelvic rest until cleared 3. RTC with any concerns or with heavy bleeding, fevers or severe abdominal pain.  Dr. Glennon

## 2024-01-30 ENCOUNTER — Telehealth: Payer: Self-pay | Admitting: *Deleted

## 2024-01-30 ENCOUNTER — Encounter: Payer: Self-pay | Admitting: Obstetrics and Gynecology

## 2024-01-30 ENCOUNTER — Ambulatory Visit: Admitting: Obstetrics and Gynecology

## 2024-01-30 VITALS — BP 104/72 | HR 91 | Wt 137.0 lb

## 2024-01-30 DIAGNOSIS — N898 Other specified noninflammatory disorders of vagina: Secondary | ICD-10-CM

## 2024-01-30 DIAGNOSIS — Z09 Encounter for follow-up examination after completed treatment for conditions other than malignant neoplasm: Secondary | ICD-10-CM

## 2024-01-30 LAB — WET PREP FOR TRICH, YEAST, CLUE

## 2024-01-30 MED ORDER — CLINDAMYCIN PHOSPHATE (1 DOSE) 2 % VA CREA: 1.0000 | TOPICAL_CREAM | VAGINAL | 0 refills | Status: DC

## 2024-01-30 NOTE — Telephone Encounter (Signed)
-----   Message from Almarie Carpen, MD sent at 01/30/2024  4:13 PM EST ----- Can you let her know there was BV on the swab and I have sent in clindess PV x1 to her pharmacy Thank you Dr. Carpen

## 2024-01-30 NOTE — Telephone Encounter (Signed)
 Call placed to patient, left detailed message, ok per dpr.  Advised of results per Dr. Glennon. Return call to office at [phone], option 4,  if any additional questions.   Encounter closed.

## 2024-01-30 NOTE — Progress Notes (Signed)
 Patient presents for 6 week postop from [facility], bilateral salpingectomy, cystoscopy. She is doing well. No fevers, VB, dysuria or severe abdominal pain. Using PV estrogen 3 times a week  BP 104/72 (BP Location: Left Arm, Patient Position: Sitting, Cuff Size: Normal)   Pulse 91   Wt 137 lb (62.1 kg)   SpO2 96%   BMI 25.06 kg/m   Abdomen: incisions I/c/d, NT, ND Discharge c/w bv   A/p PO from [facility] 6 weeks doing well Wet mount sent and with BV. Clindess one time PV treatment sent. 2. Pelvic rest until cleared 3. RTC with any concerns or with heavy bleeding, fevers or severe abdominal pain and with 10 wk check.  Dr. Glennon

## 2024-01-31 ENCOUNTER — Ambulatory Visit: Payer: Self-pay | Admitting: Obstetrics and Gynecology

## 2024-02-12 ENCOUNTER — Encounter: Payer: Self-pay | Admitting: Internal Medicine

## 2024-02-14 ENCOUNTER — Emergency Department (HOSPITAL_COMMUNITY)

## 2024-02-14 ENCOUNTER — Encounter (HOSPITAL_COMMUNITY): Payer: Self-pay

## 2024-02-14 ENCOUNTER — Observation Stay (HOSPITAL_COMMUNITY)
Admission: EM | Admit: 2024-02-14 | Discharge: 2024-02-15 | Disposition: A | Discharge status: Home / Self Care | Attending: Internal Medicine | Admitting: Internal Medicine

## 2024-02-14 ENCOUNTER — Other Ambulatory Visit: Payer: Self-pay

## 2024-02-14 DIAGNOSIS — R531 Weakness: Principal | ICD-10-CM

## 2024-02-14 DIAGNOSIS — F17210 Nicotine dependence, cigarettes, uncomplicated: Secondary | ICD-10-CM | POA: Diagnosis not present

## 2024-02-14 DIAGNOSIS — Z7982 Long term (current) use of aspirin: Secondary | ICD-10-CM | POA: Diagnosis not present

## 2024-02-14 DIAGNOSIS — Z79899 Other long term (current) drug therapy: Secondary | ICD-10-CM | POA: Diagnosis not present

## 2024-02-14 DIAGNOSIS — F1010 Alcohol abuse, uncomplicated: Secondary | ICD-10-CM | POA: Insufficient documentation

## 2024-02-14 DIAGNOSIS — Z7902 Long term (current) use of antithrombotics/antiplatelets: Secondary | ICD-10-CM | POA: Insufficient documentation

## 2024-02-14 DIAGNOSIS — Z720 Tobacco use: Secondary | ICD-10-CM | POA: Insufficient documentation

## 2024-02-14 DIAGNOSIS — I10 Essential (primary) hypertension: Secondary | ICD-10-CM | POA: Insufficient documentation

## 2024-02-14 DIAGNOSIS — F1910 Other psychoactive substance abuse, uncomplicated: Secondary | ICD-10-CM | POA: Diagnosis not present

## 2024-02-14 DIAGNOSIS — I639 Cerebral infarction, unspecified: Secondary | ICD-10-CM | POA: Diagnosis not present

## 2024-02-14 DIAGNOSIS — Z7189 Other specified counseling: Secondary | ICD-10-CM

## 2024-02-14 LAB — I-STAT CHEM 8, ED
BUN: 11 mg/dL (ref 6–20)
Calcium, Ion: 1.26 mmol/L (ref 1.15–1.40)
Chloride: 104 mmol/L (ref 98–111)
Creatinine, Ser: 0.8 mg/dL (ref 0.44–1.00)
Glucose, Bld: 113 mg/dL — ABNORMAL HIGH (ref 70–99)
HCT: 45 % (ref 36.0–46.0)
Hemoglobin: 15.3 g/dL — ABNORMAL HIGH (ref 12.0–15.0)
Potassium: 4.1 mmol/L (ref 3.5–5.1)
Sodium: 140 mmol/L (ref 135–145)
TCO2: 23 mmol/L (ref 22–32)

## 2024-02-14 LAB — DIFFERENTIAL
Abs Immature Granulocytes: 0.03 10*3/uL (ref 0.00–0.07)
Basophils Absolute: 0.1 10*3/uL (ref 0.0–0.1)
Basophils Relative: 1 %
Eosinophils Absolute: 0.3 10*3/uL (ref 0.0–0.5)
Eosinophils Relative: 4 %
Immature Granulocytes: 1 %
Lymphocytes Relative: 41 %
Lymphs Abs: 2.7 10*3/uL (ref 0.7–4.0)
Monocytes Absolute: 0.5 10*3/uL (ref 0.1–1.0)
Monocytes Relative: 8 %
Neutro Abs: 2.9 10*3/uL (ref 1.7–7.7)
Neutrophils Relative %: 45 %

## 2024-02-14 LAB — COMPREHENSIVE METABOLIC PANEL WITH GFR
ALT: 30 U/L (ref 0–44)
AST: 29 U/L (ref 15–41)
Albumin: 4.6 g/dL (ref 3.5–5.0)
Alkaline Phosphatase: 89 U/L (ref 38–126)
Anion gap: 15 (ref 5–15)
BUN: 10 mg/dL (ref 6–20)
CO2: 21 mmol/L — ABNORMAL LOW (ref 22–32)
Calcium: 10.3 mg/dL (ref 8.9–10.3)
Chloride: 104 mmol/L (ref 98–111)
Creatinine, Ser: 0.59 mg/dL (ref 0.44–1.00)
GFR, Estimated: >60 mL/min
Glucose, Bld: 123 mg/dL — ABNORMAL HIGH (ref 70–99)
Potassium: 4.2 mmol/L (ref 3.5–5.1)
Sodium: 140 mmol/L (ref 135–145)
Total Bilirubin: 0.2 mg/dL (ref 0.0–1.2)
Total Protein: 7.6 g/dL (ref 6.5–8.1)

## 2024-02-14 LAB — URINE DRUG SCREEN
Amphetamines: NEGATIVE
Barbiturates: NEGATIVE
Benzodiazepines: NEGATIVE
Cocaine: POSITIVE — AB
Fentanyl: NEGATIVE
Methadone Scn, Ur: NEGATIVE
Opiates: NEGATIVE
Tetrahydrocannabinol: POSITIVE — AB

## 2024-02-14 LAB — APTT: aPTT: 27 s (ref 24–36)

## 2024-02-14 LAB — CBC
HCT: 45.4 % (ref 36.0–46.0)
Hemoglobin: 15.6 g/dL — ABNORMAL HIGH (ref 12.0–15.0)
MCH: 33.5 pg (ref 26.0–34.0)
MCHC: 34.4 g/dL (ref 30.0–36.0)
MCV: 97.4 fL (ref 80.0–100.0)
Platelets: 337 10*3/uL (ref 150–400)
RBC: 4.66 MIL/uL (ref 3.87–5.11)
RDW: 12 % (ref 11.5–15.5)
WBC: 6.6 10*3/uL (ref 4.0–10.5)
nRBC: 0 % (ref 0.0–0.2)

## 2024-02-14 LAB — MAGNESIUM: Magnesium: 1.9 mg/dL (ref 1.7–2.4)

## 2024-02-14 LAB — PHOSPHORUS: Phosphorus: 3.6 mg/dL (ref 2.5–4.6)

## 2024-02-14 LAB — CBG MONITORING, ED: Glucose-Capillary: 120 mg/dL — ABNORMAL HIGH (ref 70–99)

## 2024-02-14 LAB — PRO BRAIN NATRIURETIC PEPTIDE: Pro Brain Natriuretic Peptide: 62.4 pg/mL

## 2024-02-14 LAB — PROTIME-INR
INR: 1 (ref 0.8–1.2)
Prothrombin Time: 13.3 s (ref 11.4–15.2)

## 2024-02-14 LAB — ETHANOL: Alcohol, Ethyl (B): 144 mg/dL — ABNORMAL HIGH

## 2024-02-14 MED ORDER — DIPHENHYDRAMINE HCL 50 MG/ML IJ SOLN
12.5000 mg | Freq: Once | INTRAMUSCULAR | Status: DC
  Filled 2024-02-14: qty 1

## 2024-02-14 MED ORDER — OXYCODONE HCL 5 MG PO TABS: 5.0000 mg | ORAL_TABLET | ORAL | Status: DC | PRN

## 2024-02-14 MED ORDER — ONDANSETRON HCL 4 MG/2ML IJ SOLN: 4.0000 mg | Freq: Four times a day (QID) | INTRAMUSCULAR | Status: DC | PRN

## 2024-02-14 MED ORDER — HYDROMORPHONE HCL 1 MG/ML IJ SOLN: 0.5000 mg | INTRAMUSCULAR | Status: DC | PRN

## 2024-02-14 MED ORDER — FLEET ENEMA RE ENEM: 1.0000 | ENEMA | Freq: Once | RECTAL | Status: DC | PRN

## 2024-02-14 MED ORDER — ACETAMINOPHEN 650 MG RE SUPP: 650.0000 mg | Freq: Four times a day (QID) | RECTAL | Status: DC | PRN

## 2024-02-14 MED ORDER — IPRATROPIUM BROMIDE 0.02 % IN SOLN: 0.5000 mg | Freq: Four times a day (QID) | RESPIRATORY_TRACT | Status: DC | PRN

## 2024-02-14 MED ORDER — SODIUM CHLORIDE 0.9 % IV SOLN: INTRAVENOUS | Status: AC

## 2024-02-14 MED ORDER — TRAZODONE HCL 50 MG PO TABS
25.0000 mg | ORAL_TABLET | Freq: Every evening | ORAL | Status: DC | PRN
  Administered 2024-02-14: 25 mg via ORAL
  Filled 2024-02-14: qty 1

## 2024-02-14 MED ORDER — THIAMINE HCL 100 MG/ML IJ SOLN
100.0000 mg | Freq: Every day | INTRAMUSCULAR | Status: DC
  Administered 2024-02-14: 100 mg via INTRAVENOUS
  Filled 2024-02-14: qty 2

## 2024-02-14 MED ORDER — SODIUM CHLORIDE 0.9% FLUSH
3.0000 mL | Freq: Two times a day (BID) | INTRAVENOUS | Status: DC
  Administered 2024-02-14: 3 mL via INTRAVENOUS

## 2024-02-14 MED ORDER — HYDRALAZINE HCL 20 MG/ML IJ SOLN: 10.0000 mg | INTRAMUSCULAR | Status: DC | PRN

## 2024-02-14 MED ORDER — LORAZEPAM 2 MG/ML IJ SOLN: 1.0000 mg | INTRAMUSCULAR | Status: DC | PRN

## 2024-02-14 MED ORDER — SODIUM CHLORIDE 0.9 % IV SOLN: INTRAVENOUS | Status: DC

## 2024-02-14 MED ORDER — LORAZEPAM 1 MG PO TABS: 1.0000 mg | ORAL_TABLET | ORAL | Status: DC | PRN

## 2024-02-14 MED ORDER — SENNOSIDES-DOCUSATE SODIUM 8.6-50 MG PO TABS: 1.0000 | ORAL_TABLET | Freq: Every evening | ORAL | Status: DC | PRN

## 2024-02-14 MED ORDER — FOLIC ACID 1 MG PO TABS
1.0000 mg | ORAL_TABLET | Freq: Every day | ORAL | Status: DC
  Administered 2024-02-14 – 2024-02-15 (×2): 1 mg via ORAL
  Filled 2024-02-14 (×2): qty 1

## 2024-02-14 MED ORDER — PROCHLORPERAZINE EDISYLATE 10 MG/2ML IJ SOLN
10.0000 mg | Freq: Once | INTRAMUSCULAR | Status: AC
  Administered 2024-02-14: 10 mg via INTRAVENOUS
  Filled 2024-02-14: qty 2

## 2024-02-14 MED ORDER — STROKE: EARLY STAGES OF RECOVERY BOOK
Freq: Once | Status: AC
  Filled 2024-02-14: qty 1

## 2024-02-14 MED ORDER — LACTATED RINGERS IV BOLUS
1000.0000 mL | Freq: Once | INTRAVENOUS | Status: AC
  Administered 2024-02-14: 1000 mL via INTRAVENOUS

## 2024-02-14 MED ORDER — ACETAMINOPHEN 325 MG PO TABS
650.0000 mg | ORAL_TABLET | Freq: Four times a day (QID) | ORAL | Status: DC | PRN
  Filled 2024-02-14: qty 2

## 2024-02-14 MED ORDER — ADULT MULTIVITAMIN W/MINERALS CH
1.0000 | ORAL_TABLET | Freq: Every day | ORAL | Status: DC
  Administered 2024-02-14 – 2024-02-15 (×2): 1 via ORAL
  Filled 2024-02-14 (×2): qty 1

## 2024-02-14 MED ORDER — PANTOPRAZOLE SODIUM 40 MG PO TBEC
40.0000 mg | DELAYED_RELEASE_TABLET | Freq: Every day | ORAL | Status: DC
  Administered 2024-02-14 – 2024-02-15 (×2): 40 mg via ORAL
  Filled 2024-02-14 (×2): qty 1

## 2024-02-14 MED ORDER — THIAMINE MONONITRATE 100 MG PO TABS
100.0000 mg | ORAL_TABLET | Freq: Every day | ORAL | Status: DC
  Administered 2024-02-15: 100 mg via ORAL
  Filled 2024-02-14 (×2): qty 1

## 2024-02-14 MED ORDER — IOHEXOL 350 MG/ML SOLN
100.0000 mL | Freq: Once | INTRAVENOUS | Status: AC | PRN
  Administered 2024-02-14: 100 mL via INTRAVENOUS

## 2024-02-14 MED ORDER — ASPIRIN 81 MG PO TBEC
81.0000 mg | DELAYED_RELEASE_TABLET | Freq: Every day | ORAL | Status: DC
  Administered 2024-02-14 – 2024-02-15 (×2): 81 mg via ORAL
  Filled 2024-02-14 (×2): qty 1

## 2024-02-14 MED ORDER — ONDANSETRON HCL 4 MG PO TABS: 4.0000 mg | ORAL_TABLET | Freq: Four times a day (QID) | ORAL | Status: DC | PRN

## 2024-02-14 MED ORDER — NALTREXONE HCL 50 MG PO TABS: 50.0000 mg | ORAL_TABLET | Freq: Every day | ORAL | Status: DC

## 2024-02-14 MED ORDER — VITAMIN D (ERGOCALCIFEROL) 1.25 MG (50000 UNIT) PO CAPS: 50000.0000 [IU] | ORAL_CAPSULE | ORAL | Status: DC

## 2024-02-14 MED ORDER — HEPARIN SODIUM (PORCINE) 5000 UNIT/ML IJ SOLN
5000.0000 [IU] | Freq: Three times a day (TID) | INTRAMUSCULAR | Status: DC
  Administered 2024-02-14 – 2024-02-15 (×2): 5000 [IU] via SUBCUTANEOUS
  Filled 2024-02-14 (×2): qty 1

## 2024-02-14 NOTE — Assessment & Plan Note (Addendum)
 Holding BP meds, allowing for permissive hypertension - Home medication: Losartan

## 2024-02-14 NOTE — ED Notes (Signed)
 CCMD called, pt on hallway monitor

## 2024-02-14 NOTE — Assessment & Plan Note (Signed)
-  Left upper and lower extremity weakness, with slurred speech -Continue with neurochecks -Symptoms are improving -Allowing for permissive hypertension -EDP consulting neurology appreciate further evaluation recommendation -starting the patient on aspirin  Plavix , and high-dose statins -Follow-up with labs, panel -2D echocardiogram

## 2024-02-14 NOTE — ED Notes (Signed)
 Patient requested water. Patient and patient's family informed that an SLP eval must be complete due to failing bedside swallow screen earlier this morning. SLP eval consult ordered by this RN.

## 2024-02-14 NOTE — ED Notes (Signed)
 Dr. Adalberto dates joined on tele neuro

## 2024-02-14 NOTE — ED Notes (Signed)
Tele Neuro activated

## 2024-02-14 NOTE — Hospital Course (Signed)
 Destiny Travis is a 54 year old female HTN, chronic tobacco abuse, marijuana use Presented with left-sided weakness and slurred speech this a.m. at 0430. Patient reports woke up this morning felt heaviness in her left arm and leg, Noted numbness and weakness.  Husband at bedside reports also her speech was slurred. Symptoms slowly improving. Denies any headaches visual changes No recent illnesses denies any fever, chills, nausea or vomiting   ED Evaluation: Blood pressure (!) 152/94, pulse 82, temperature 97.7 F (36.5 C), temperature source Oral, resp. rate 19, SpO2 99%. LABs: CBC CMP reviewed, glucose 113, UDS positive for marijuana and cocaine, alcohol level 144   CT of the head: Negative CTA head and neck: No acute large vessel occlusion.  MRI of the brain: Acute right thalamic infarct.  Chronic small vessel ischemia

## 2024-02-14 NOTE — ED Notes (Addendum)
 Bedside swallow screen performed again per Shahmehdi, MD due to patient's symptoms improving. Patient passed swallow screen.
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
## 2024-02-14 NOTE — ED Notes (Signed)
 LWK 0000, symptoms discovered 0430 with left sided weakness and slurred speech. Total hysterectomy 12/3, had a post op infection treated beginning of January. Transferred from [facility] for MRI. Per family speech has gotten better since she arrived this morning. Last NIH with [facility] was 6 for left sided weakness and sensory on left side
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
## 2024-02-14 NOTE — ED Provider Notes (Addendum)
 Please see prior note for full history and physical.   Patient is a 54 year old female presenting today for for left-sided weakness and numbness.  Last known normal was 12:30 AM.  She woke up around 4:30 AM this morning with her dragging of left foot while walking to the bathroom.  Had lab work and CT head and CT angio head neck was normal at [facility].  They sent here for an MRI as her machine was broken.  No history of prior strokes.  Patient denies any recent drug use to me.  On exam she is alert and oriented with no apparent distress.  Visual fields are intact.  Pupils equal round reactive to light.  Full eye movement.  Decreased sensation on the left upper and lower extremity.  5 out of 5 muscle strength in the right upper and lower extremity.  3 out of 5 in the lower extremities.  No tremor.  Uvula is midline.  Normal sternocleidomastoid and trapezius strength.  Heart normal lungs clear.    CBC without any signs of leukocytosis.  Urine drug test positive for cocaine and THC.  Negative CT head and CT angio head neck. MRI did not reveal any an acute right thalamic infarct.     BP (!) 152/94 (BP Location: Right Arm)   Pulse 82   Temp 97.7 F (36.5 C) (Oral)   Resp 19   SpO2 99%   Case discussed with Dr. Dasie, who is in agreement with plan.   Consult neurology who will see the patient.  Discussed case with Dr. Adriana, hospitalist, who will admit the patient for stroke workup.   Braxton Dubois, PA-C 02/14/24 1705     Ayo Smoak, PA-C 02/14/24 1716    Dasie Faden, MD 02/16/24 1158

## 2024-02-14 NOTE — Assessment & Plan Note (Signed)
 Counseled regarding tobacco cessation -Accepted NicoDerm patch, will be provided

## 2024-02-14 NOTE — ED Provider Notes (Addendum)
 MRI machine is broken.  Patient has left-sided weakness has been evaluated by telemetry neurology.  No LVO on imaging.  Recommend an MRI to further evaluate for stroke.  She is fairly weak on exam and I do suspect stroke.  She is not having any back pain or neck pain do not have any other obvious etiology of the source of her weakness.  It seems like she is giving me pretty good effort on exam.  Will transfer her to [facility] for MRI given that MRI is broken here was a long period.  Plan for MRI reevaluation.  If MRI is negative may need expanded workup.  Dr. Emil accepts the patient in transfer.  Plan for MRI and reengage neurology.  This chart was dictated using voice recognition software.  Despite best efforts to proofread,  errors can occur which can change the documentation meaning.    Ruthe Cornet, DO 02/14/24 1224    Ruthe Cornet, DO 02/14/24 1226

## 2024-02-14 NOTE — ED Notes (Signed)
Pt unable to void at this moment. 

## 2024-02-14 NOTE — ED Notes (Signed)
 [facility] called.

## 2024-02-14 NOTE — Consult Note (Signed)
 [facility] Consult Services   Patient Name:   Destiny Travis, Destiny Travis Date of Birth:   Jul 10, 1970 Identification Number:   MRN - [id number] Date of Service:   02/14/2024 06:49:24  Diagnosis:       I63.89 - Cerebrovascular accident (CVA) due to other mechanism [facility])  Impression:      54 year old right-handed woman with a history of hypertension and tobacco use, who presents with left-sided weakness and her arm and leg. Also with slurred speech altered mentation. Motor symptoms localized to left brain.    Not a candidate for IV thrombolytic based on last known well. No evidence of a large vessel occlusion to that would be amenable to thrombectomy. Noted to have acute alcohol intoxication, which could account for her mental status and slurred speech. No associated cortical signs.  Will need further assessment to rule out stroke or other structural abnormality as a cause of her symptoms. Had recent fall where she hit her head.  Our recommendations are outlined below.  Recommendations:        Stroke/Telemetry Floor       Neuro Checks       Bedside Swallow Eval       DVT Prophylaxis       IV Fluids, Normal Saline       Head of Bed 30 Degrees       Euglycemia and Avoid Hyperthermia (PRN Acetaminophen )       Recommend brain MRI to assess for stroke and other structural abnormalities that could contribute to her symptoms.  Sign Out:       Discussed with Emergency Department Provider    ------------------------------------------------------------------------------  Advanced Imaging: CTA Head and Neck Completed.  CTP Completed.  LVO:No  Patient is not a candidate for NIR   Metrics: Last Known Well: 02/13/2024 23:00:00 Arrival Time: 02/14/2024 06:12:00 Activation Time: 02/14/2024 06:49:24 Initial Response Time: 02/14/2024 06:50:47 Symptoms: Left-sided weakness. Initial patient interaction: 02/14/2024 06:55:40 NIHSS Assessment Completed: 02/14/2024  07:06:00 Patient is not a candidate for Thrombolytic. Thrombolytic Medical Decision: 02/14/2024 07:06:50 Patient was not deemed candidate for Thrombolytic because of following reasons: LKW outside 4.5 hr window. .  CT Head: I personally reviewed all the CT images that were available to me and it showed: No evidence of an acute intracranial process.  Primary Provider Notified of Diagnostic Impression and Management Plan on: 02/14/2024 07:51:52    ------------------------------------------------------------------------------  History of Present Illness: Patient is a 54 year old Female.  Patient was brought by private transportation with symptoms of Left-sided weakness.  She presents with left-sided weakness, noted on waking up this morning. Last known well was last night before she went to bed. Has not had similar symptoms in the past. Is seen groggy per husband seems groggy and that is not her baseline. Last Friday had fallen and hit her head on the counter. Reports it occurred after coughing. Did not lose consciousness. Has had headaches since then.  Recent hysterectomy    Past Medical History:      Hypertension      There is no history of Diabetes Mellitus      There is no history of Hyperlipidemia  Medications:  No Anticoagulant use  No Antiplatelet use Reviewed EMR for current medications  Allergies:  Reviewed  Social History: Patient Is: Married Smoking: Yes Alcohol Use: Yes  Family History:  There is no family history of premature cerebrovascular disease pertinent to this consultation  ROS : 14 Points Review of Systems was performed and was negative  except mentioned in HPI.  Past Surgical History: There Is Surgical History of:  Hysterectomy December 2025     Examination: BP(140/97), Pulse(79), Blood Glucose(131) 1A: Level of Consciousness - Arouses to minor stimulation + 1 1B: Ask Month and Age - 1 Question Right + 1 1C: Blink Eyes & Squeeze Hands -  Performs Both Tasks + 0 2: Test Horizontal Extraocular Movements - Normal + 0 3: Test Visual Fields - No Visual Loss + 0 4: Test Facial Palsy (Use Grimace if Obtunded) - Normal symmetry + 0 5A: Test Left Arm Motor Drift - No Effort Against Gravity + 3 5B: Test Right Arm Motor Drift - No Drift for 10 Seconds + 0 6A: Test Left Leg Motor Drift - Some Effort Against Gravity + 2 6B: Test Right Leg Motor Drift - No Drift for 5 Seconds + 0 7: Test Limb Ataxia (FNF/Heel-Shin) - No Ataxia + 0 8: Test Sensation - Mild-Moderate Loss: Less Sharp/More Dull + 1 9: Test Language/Aphasia - Mild-Moderate Aphasia: Some Obvious Changes, Without Significant Limitation + 1 10: Test Dysarthria - Mild-Moderate Dysarthria: Slurring but can be understood + 1 11: Test Extinction/Inattention - No abnormality + 0  NIHSS Score: 10   Pre-Morbid Modified Rankin Scale: 1 Points = No significant disability despite symptoms; able to carry out all usual duties and activities  Spoke with : Dr Trine I reviewed the available imaging via [facility] and initiated discussion with the primary provider  This consult was conducted in real time using interactive audio and video technology. Patient was informed of the technology being used for this visit and agreed to proceed. Patient located in hospital and provider located at home/office setting.   Patient is being evaluated for possible acute neurologic impairment and high probability of imminent or life-threatening deterioration. I spent total of 45 minutes providing care to this patient, including time for face to face visit via telemedicine, review of medical records, imaging studies and discussion of findings with providers, the patient and/or family.    Dr Claudette Dates   [facility] For Inpatient follow-up with [facility] physician please call [facility] at [phone]. As we are not an outpatient service for any post hospital discharge needs please contact the  hospital for assistance. If you have any questions for the [facility] physicians or need to reconsult for clinical or diagnostic changes please contact us  via [facility] at [phone].  Non-radiologist review of imaging performed to assist with emergent clinical decision-making. Remote physician workstations do not possess the same resolution, calibration, or diagnostic capabilities as hospital-based radiology reading stations, and formal radiologist read is necessary.   Signature : Claudette Dates

## 2024-02-14 NOTE — ED Notes (Signed)
Patient returned from MRI. Husband at bedside.

## 2024-02-14 NOTE — ED Triage Notes (Addendum)
 Pts husband reports that pt was dragging her left leg at 0430 this morning while trying to go to the bathroom. Pt has left sided deficits. Pt states that she was walking fine last night. Pt has had a headache for a couple of days after hitting her head. Pt reports a recent surgery.

## 2024-02-14 NOTE — Assessment & Plan Note (Signed)
 Patient reports of daily drinking -Counseled regarding alcohol use/abuse and cessation -Will initiate CIWA protocol

## 2024-02-14 NOTE — ED Notes (Signed)
 Patient transported to CT

## 2024-02-14 NOTE — Assessment & Plan Note (Signed)
-  Urine drug screen positive for marijuana and cocaine -Patient has been counseled regarding substance use

## 2024-02-15 ENCOUNTER — Observation Stay (HOSPITAL_COMMUNITY)

## 2024-02-15 DIAGNOSIS — I6389 Other cerebral infarction: Secondary | ICD-10-CM | POA: Diagnosis not present

## 2024-02-15 DIAGNOSIS — R29702 NIHSS score 2: Secondary | ICD-10-CM | POA: Diagnosis not present

## 2024-02-15 DIAGNOSIS — I10 Essential (primary) hypertension: Secondary | ICD-10-CM | POA: Diagnosis not present

## 2024-02-15 DIAGNOSIS — I6381 Other cerebral infarction due to occlusion or stenosis of small artery: Secondary | ICD-10-CM | POA: Diagnosis not present

## 2024-02-15 DIAGNOSIS — F1210 Cannabis abuse, uncomplicated: Secondary | ICD-10-CM | POA: Diagnosis not present

## 2024-02-15 DIAGNOSIS — F1410 Cocaine abuse, uncomplicated: Secondary | ICD-10-CM | POA: Diagnosis not present

## 2024-02-15 DIAGNOSIS — E785 Hyperlipidemia, unspecified: Secondary | ICD-10-CM | POA: Diagnosis not present

## 2024-02-15 LAB — LIPID PANEL
Cholesterol: 185 mg/dL (ref 0–200)
HDL: 64 mg/dL
LDL Cholesterol: 93 mg/dL (ref 0–99)
Total CHOL/HDL Ratio: 2.9 ratio
Triglycerides: 141 mg/dL
VLDL: 28 mg/dL (ref 0–40)

## 2024-02-15 LAB — ECHOCARDIOGRAM COMPLETE
AR max vel: 2.96 cm2
AV Area VTI: 2.52 cm2
AV Area mean vel: 2.88 cm2
AV Mean grad: 2 mmHg
AV Peak grad: 3.4 mmHg
Ao pk vel: 0.92 m/s
Area-P 1/2: 3.77 cm2
Calc EF: 56.7 %
S' Lateral: 2.8 cm
Single Plane A2C EF: 55.8 %
Single Plane A4C EF: 58 %

## 2024-02-15 LAB — CBC
HCT: 41.1 % (ref 36.0–46.0)
Hemoglobin: 14.4 g/dL (ref 12.0–15.0)
MCH: 33.3 pg (ref 26.0–34.0)
MCHC: 35 g/dL (ref 30.0–36.0)
MCV: 95.1 fL (ref 80.0–100.0)
Platelets: 306 10*3/uL (ref 150–400)
RBC: 4.32 MIL/uL (ref 3.87–5.11)
RDW: 12.3 % (ref 11.5–15.5)
WBC: 8.6 10*3/uL (ref 4.0–10.5)
nRBC: 0 % (ref 0.0–0.2)

## 2024-02-15 LAB — BASIC METABOLIC PANEL WITH GFR
Anion gap: 9 (ref 5–15)
BUN: 12 mg/dL (ref 6–20)
CO2: 26 mmol/L (ref 22–32)
Calcium: 9.2 mg/dL (ref 8.9–10.3)
Chloride: 105 mmol/L (ref 98–111)
Creatinine, Ser: 0.7 mg/dL (ref 0.44–1.00)
GFR, Estimated: >60 mL/min
Glucose, Bld: 81 mg/dL (ref 70–99)
Potassium: 3.9 mmol/L (ref 3.5–5.1)
Sodium: 140 mmol/L (ref 135–145)

## 2024-02-15 LAB — HEMOGLOBIN A1C
Hgb A1c MFr Bld: 5.3 % (ref 4.8–5.6)
Mean Plasma Glucose: 105.41 mg/dL

## 2024-02-15 MED ORDER — CLOPIDOGREL BISULFATE 75 MG PO TABS
75.0000 mg | ORAL_TABLET | Freq: Every day | ORAL | Status: DC
  Administered 2024-02-15: 75 mg via ORAL
  Filled 2024-02-15: qty 1

## 2024-02-15 MED ORDER — ATORVASTATIN CALCIUM 40 MG PO TABS
40.0000 mg | ORAL_TABLET | Freq: Every day | ORAL | Status: DC
  Administered 2024-02-15: 40 mg via ORAL
  Filled 2024-02-15: qty 1

## 2024-02-15 MED ORDER — ATORVASTATIN CALCIUM 40 MG PO TABS: 40.0000 mg | ORAL_TABLET | Freq: Every day | ORAL | 1 refills | Status: AC

## 2024-02-15 MED ORDER — CLOPIDOGREL BISULFATE 75 MG PO TABS: 75.0000 mg | ORAL_TABLET | Freq: Every day | ORAL | 0 refills | Status: AC

## 2024-02-15 MED ORDER — ASPIRIN 81 MG PO TBEC: 81.0000 mg | DELAYED_RELEASE_TABLET | Freq: Every day | ORAL | 2 refills | Status: AC

## 2024-02-15 MED ORDER — VITAMIN B-1 100 MG PO TABS: 100.0000 mg | ORAL_TABLET | Freq: Every day | ORAL | 0 refills | Status: AC

## 2024-02-15 MED ORDER — FOLIC ACID 1 MG PO TABS: 1.0000 mg | ORAL_TABLET | Freq: Every day | ORAL | 0 refills | Status: AC

## 2024-02-15 NOTE — Evaluation (Signed)
 Physical Therapy Evaluation Patient Details Name: Destiny Travis MRN: [id number] DOB: May 10, 1970 Today's Date: 02/15/2024  History of Present Illness  Pt is a 54 y.o. female who presented to ED with left-sided weakness, slurred speech, and altered mentation. Noted to have acute alcohol intoxication. Reports a recent fall last week where she hit her head on the counter and denies LOC, but c/o HA. NIHSS 10. MRI showed acute right thalamic infarct. PMHx: HTN, tobacco use, and marijuana use.   Clinical Impression  Pt admitted with above diagnosis. 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 She lives with her husband and daughter in a two level house with 3-5 STE and a flight to the bedroom and full bathroom. Pt currently with functional limitations due to the deficits listed below (see PT Problem List). She performed transfers with supervision and required CGA for gait/stairs without an AD. Pt is currently limited by LHB weakness (proximal>distal) and impaired balance. Educated pt on signs/symptoms of a stroke using BE FAST and LLE HEP for strengthening. Pt verbalized understanding and demonstrated a few reps of each exercise. Pt will benefit from acute skilled PT to increase her independence and safety with mobility to allow discharge. Recommend OPPT to increased strength, improve balance, decrease fall risk, and optimize safety and independence with functional mobility.    If plan is discharge home, recommend the following: A little help with walking and/or transfers;A little help with bathing/dressing/bathroom;Assistance with cooking/housework;Assist for transportation;Help with stairs or ramp for entrance   Can travel by private vehicle        Equipment Recommendations None recommended by PT  Recommendations for Other Services       Functional Status Assessment Patient has had a recent decline in their functional  status and demonstrates the ability to make significant improvements in function in a reasonable and predictable amount of time.     Precautions / Restrictions Precautions Precautions: Fall Recall of Precautions/Restrictions: Intact Restrictions Weight Bearing Restrictions Per Provider Order: No      Mobility  Bed Mobility               General bed mobility comments: Not assessed. Pt greeted seated EOB.    Transfers Overall transfer level: Needs assistance Equipment used: None Transfers: Sit to/from Stand Sit to Stand: Supervision           General transfer comment: Pt stood from recliner chair. She pushed up with BUE support. Good eccentric control.    Ambulation/Gait Ambulation/Gait assistance: Contact guard assist Gait Distance (Feet): 150 Feet Assistive device: None Gait Pattern/deviations: Step-through pattern, Decreased stride length, Decreased weight shift to left Gait velocity: decr Gait velocity interpretation: <1.8 ft/sec, indicate of risk for recurrent falls   General Gait Details: Pt ambulated with a reciprocal gait pattern. She demonstrated limited weight shift to left and reported c/f the knee buckling. No buckling noted, but pt was unsteady. Decreased step length for increased confidence.  Stairs Stairs: Yes Stairs assistance: Contact guard assist Stair Management: No rails, Forwards, Step to pattern Number of Stairs: 2 (x5) General stair comments: Educated pt to ascend with RLE and descend with LLE taking each step on at a time. Discussed how her husband she be positioned to support her in/out of the home. Advised for him to stay towards the left side given she is weaker there. PT provided HHA for pt to ascend and pt held onto PT's shoulders to descend. With increased reps she reduced  the weight through her hands.  Wheelchair Mobility     Tilt Bed    Modified Rankin (Stroke Patients Only)       Balance Overall balance assessment: Needs  assistance Sitting-balance support: No upper extremity supported, Feet supported Sitting balance-Leahy Scale: Good     Standing balance support: No upper extremity supported, During functional activity Standing balance-Leahy Scale: Fair Standing balance comment: Pt unsteady requiring CGA for safety and HHA for stairs.                             Pertinent Vitals/Pain Pain Assessment Pain Assessment: No/denies pain    Home Living Family/patient expects to be discharged to:: Private residence Living Arrangements: Spouse/significant other;Children (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) Available Help at Discharge: Family;Available 24 hours/day Type of Home: House Home Access: Stairs to enter Entrance Stairs-Rails: None Entrance Stairs-Number of Steps: 5 Alternate Level Stairs-Number of Steps: landing Home Layout: Multi-level;1/2 bath on main level;Full bath on main level Home Equipment: Cane - single point;Rolling Walker (2 wheels);Rollator (4 wheels);Wheelchair - manual      Prior Function Prior Level of Function : Independent/Modified Independent             Mobility Comments: Ind, 1x fall last week d/t cough. Pt reports she hasn't been driving or physical assisting in lifting to care for her daughter since her hysterectomy (12/2023). ADLs Comments: Ind, 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     Extremity/Trunk Assessment   Upper Extremity Assessment Upper Extremity Assessment: Defer to OT evaluation LUE Deficits / Details: Grossly 3+/5 MMT, ROM WFL LUE Sensation: WNL LUE Coordination: WNL    Lower Extremity Assessment Lower Extremity Assessment: LLE deficits/detail LLE Deficits / Details: Hip strength grossly 3/5; Knee strength grossly 3+/5; Ankle strength grossly 4/5 LLE Sensation: WNL LLE Coordination: WNL    Cervical / Trunk Assessment Cervical / Trunk Assessment: Normal  Communication   Communication Communication: No apparent difficulties     Cognition Arousal: Alert Behavior During Therapy: WFL for tasks assessed/performed   PT - Cognitive impairments: No apparent impairments                       PT - Cognition Comments: Pt A,Ox4 Following commands: Intact       Cueing Cueing Techniques: Verbal cues     General Comments General comments (skin integrity, edema, etc.): VSS on RA. Educated pt on signs/symptom of a stroke using BE FAST. Discussed LLE HEP for strengthening.    Exercises General Exercises - Lower Extremity Ankle Circles/Pumps: Seated, Left, AROM, 5 reps Quad Sets: Seated, Left, AROM, Strengthening, 5 reps (with legs elevated in recliner chair; hold for 5 seconds.) Long Arc Quad: Seated, Left, AROM, Strengthening, 5 reps (hold for 5 seconds) Hip Flexion/Marching: Seated, Left, AROM, 5 reps   Assessment/Plan    PT Assessment Patient needs continued PT services  PT Problem List Decreased strength;Decreased balance;Decreased mobility       PT Treatment Interventions DME instruction;Gait training;Stair training;Functional mobility training;Therapeutic activities;Therapeutic exercise;Balance training;Neuromuscular re-education;Patient/family education    PT Goals (Current goals can be found in the Care Plan section)  Acute Rehab PT Goals Patient Stated Goal: Regain independence and strength PT Goal Formulation: With patient Time For Goal Achievement: 02/29/24 Potential to Achieve Goals: Good    Frequency Min 2X/week     Co-evaluation               AM-PAC  PT 6 Clicks Mobility  Outcome Measure Help needed turning from your back to your side while in a flat bed without using bedrails?: A Little Help needed moving from lying on your back to sitting on the side of a flat bed without using bedrails?: A Little Help needed moving to and from a bed to a chair (including a wheelchair)?: A Little Help needed standing up from a chair using your arms (e.g., wheelchair or bedside chair)?: A  Little Help needed to walk in hospital room?: A Little Help needed climbing 3-5 steps with a railing? : A Little 6 Click Score: 18    End of Session Equipment Utilized During Treatment: Gait belt Activity Tolerance: Patient tolerated treatment well Patient left: in chair;with call bell/phone within reach;with chair alarm set Nurse Communication: Mobility status PT Visit Diagnosis: Hemiplegia and hemiparesis;Difficulty in walking, not elsewhere classified (R26.2);Other abnormalities of gait and mobility (R26.89);Unsteadiness on feet (R26.81) Hemiplegia - Right/Left: Left Hemiplegia - dominant/non-dominant: Non-dominant Hemiplegia - caused by: Cerebral infarction    Time: 9164-9142 PT Time Calculation (min) (ACUTE ONLY): 22 min   Charges:   PT Evaluation $PT Eval Moderate Complexity: 1 Mod   PT General Charges $$ ACUTE PT VISIT: 1 Visit         Randall SAUNDERS, PT, DPT Acute Rehabilitation Services Office: [phone] Secure Chat Preferred  Delon CHRISTELLA Callander 02/15/2024, 10:54 AM

## 2024-02-15 NOTE — Plan of Care (Signed)
" °  Problem: Education: Goal: Knowledge of disease or condition will improve 02/15/2024 0457 by Gwynneth Richerd LABOR, RN Outcome: Progressing 02/15/2024 0457 by Gwynneth Richerd LABOR, RN Outcome: Progressing Goal: Knowledge of secondary prevention will improve (MUST DOCUMENT ALL) Outcome: Progressing Goal: Knowledge of patient specific risk factors will improve (DELETE if not current risk factor) Outcome: Progressing   Problem: Ischemic Stroke/TIA Tissue Perfusion: Goal: Complications of ischemic stroke/TIA will be minimized 02/15/2024 0457 by Gwynneth Richerd LABOR, RN Outcome: Progressing 02/15/2024 0457 by Gwynneth Richerd LABOR, RN Outcome: Progressing   Problem: Coping: Goal: Will verbalize positive feelings about self Outcome: Progressing   Problem: Nutrition: Goal: Risk of aspiration will decrease Outcome: Progressing   "

## 2024-02-15 NOTE — Care Management (Signed)
 Transition of Care [facility]) - Inpatient Brief Assessment   Patient Details  Name: Destiny Travis MRN: [id number] Date of Birth: 1970-05-17  Transition of Care [facility]) CM/SW Contact:    Corean JAYSON Canary, RN Phone Number: 02/15/2024, 12:09 PM   Clinical Narrative:  Presents with Stroke symptoms, MRI with Acute right Thalamic infarct. Atient does not currently have a PCP  OP PT and OT recommended, sent to Neuro [facility] neuro.  No further needs identified  Transition of Care Asessment:    Continuing assessments

## 2024-02-15 NOTE — Evaluation (Signed)
 Occupational Therapy Evaluation Patient Details Name: Destiny Travis MRN: [id number] DOB: July 01, 1970 Today's Date: 02/15/2024   History of Present Illness   Pt is a 54 y.o. female who presented to ED with left-sided weakness, slurred speech, and altered mentation. Noted to have acute alcohol intoxication. Reports a recent fall last week where she hit her head on the counter and denies LOC, but c/o HA. NIHSS 10. MRI showed acute right thalamic infarct. PMHx: HTN, tobacco use, and marijuana use.     Clinical Impressions Pt admitted based on above, and was seen based on problem list below. PTA pt was independent with ADLs and IADLs. Today pt is requiring set up to CGA for ADLs. Functional transfers are  s for safety. Noted pt with deficits in strength, grossly 3+/5 on LUE, and difficulty with balance during cognitive tasks. Pt would benefit from OP OT services to improve above listed deficits, and increase independence with IADLs. OT will continue to follow acutely to maximize functional independence.     If plan is discharge home, recommend the following:   Assistance with cooking/housework;Assist for transportation     Functional Status Assessment   Patient has had a recent decline in their functional status and demonstrates the ability to make significant improvements in function in a reasonable and predictable amount of time.     Equipment Recommendations   None recommended by OT      Precautions/Restrictions   Precautions Precautions: Fall Recall of Precautions/Restrictions: Intact Restrictions Weight Bearing Restrictions Per Provider Order: No     Mobility Bed Mobility Overal bed mobility: Modified Independent       General bed mobility comments: HOB elevated    Transfers Overall transfer level: Needs assistance Equipment used: None Transfers: Sit to/from Stand Sit to Stand: Supervision           General transfer comment: S for safety, CGA for balance  with dual tasking      Balance Overall balance assessment: Needs assistance Sitting-balance support: No upper extremity supported, Feet supported Sitting balance-Leahy Scale: Normal     Standing balance support: No upper extremity supported, During functional activity Standing balance-Leahy Scale: Fair       ADL either performed or assessed with clinical judgement   ADL Overall ADL's : Needs assistance/impaired Eating/Feeding: Set up;Sitting   Grooming: Contact guard assist;Oral care;Wash/dry face;Standing           Upper Body Dressing : Set up;Sitting   Lower Body Dressing: Contact guard assist;Sit to/from stand Lower Body Dressing Details (indicate cue type and reason): Able to figure 4 BLEs Toilet Transfer: Contact guard assist;Ambulation Statistician Details (indicate cue type and reason): No AD         Functional mobility during ADLs: Contact guard assist General ADL Comments: CGA to supervision with standing tasks, difficulty with dual tasking in standing     Vision Baseline Vision/History: 1 Wears glasses Ability to See in Adequate Light: 0 Adequate Patient Visual Report: No change from baseline Vision Assessment?: No apparent visual deficits Additional Comments: Screen completed [facility]     Perception Perception: Within Functional Limits       Praxis Praxis: WFL       Pertinent Vitals/Pain Pain Assessment Pain Assessment: No/denies pain     Extremity/Trunk Assessment Upper Extremity Assessment Upper Extremity Assessment: LUE deficits/detail LUE Deficits / Details: Grossly 3+/5 MMT, ROM WFL LUE Sensation: WNL LUE Coordination: WNL   Lower Extremity Assessment Lower Extremity Assessment: Defer to PT evaluation   Cervical /  Trunk Assessment Cervical / Trunk Assessment: Normal   Communication Communication Communication: No apparent difficulties   Cognition Arousal: Alert Behavior During Therapy: WFL for tasks  assessed/performed Cognition: No apparent impairments     OT - Cognition Comments: Scored 0/28 on SBT indicating no cog impairment       Following commands: Intact       Cueing  General Comments   Cueing Techniques: Verbal cues  VSS on RA           Home Living Family/patient expects to be discharged to:: Private residence Living Arrangements: Spouse/significant other;Children (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) Available Help at Discharge: Family;Available 24 hours/day Type of Home: House Home Access: Stairs to enter [employer] of Steps: 5 Entrance Stairs-Rails: None Home Layout: Multi-level;1/2 bath on main level Alternate Level Stairs-Number of Steps: landing Alternate Level Stairs-Rails: Right;Left Bathroom Shower/Tub: Chief Strategy Officer: Standard     Home Equipment: Cane - single Librarian, Academic (2 wheels);Rollator (4 wheels);Wheelchair - manual          Prior Functioning/Environment Prior Level of Function : Independent/Modified Independent             Mobility Comments: Ind, 1x fall last week d/t cough ADLs Comments: Ind, 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    OT Problem List: Decreased strength;Impaired balance (sitting and/or standing)   OT Treatment/Interventions: Self-care/ADL training;Therapeutic exercise;Neuromuscular education;Energy conservation;DME and/or AE instruction;Therapeutic activities;Patient/family education;Balance training      OT Goals(Current goals can be found in the care plan section)   Acute Rehab OT Goals Patient Stated Goal: To go home OT Goal Formulation: With patient Time For Goal Achievement: 02/29/24 Potential to Achieve Goals: Good   OT Frequency:  Min 1X/week       AM-PAC OT 6 Clicks Daily Activity     Outcome Measure Help from another person eating meals?: None Help from another person taking care of personal grooming?: A Little Help from another person toileting,  which includes using toliet, bedpan, or urinal?: A Little Help from another person bathing (including washing, rinsing, drying)?: A Little Help from another person to put on and taking off regular upper body clothing?: A Little Help from another person to put on and taking off regular lower body clothing?: A Little 6 Click Score: 19   End of Session Equipment Utilized During Treatment: Gait belt Nurse Communication: Mobility status  Activity Tolerance: Patient tolerated treatment well Patient left: in chair;with call bell/phone within reach;with chair alarm set  OT Visit Diagnosis: Unsteadiness on feet (R26.81);Other abnormalities of gait and mobility (R26.89);Muscle weakness (generalized) (M62.81)                Time: 9241-9170 OT Time Calculation (min): 31 min Charges:  OT General Charges $OT Visit: 1 Visit OT Evaluation $OT Eval Low Complexity: 1 Low OT Treatments $Self Care/Home Management : 8-22 mins  Adrianne BROCKS, OT  Acute Rehabilitation Services Office [phone] Secure chat preferred   Adrianne GORMAN Savers 02/15/2024, 9:12 AM
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
## 2024-02-15 NOTE — Progress Notes (Signed)
 Communicated with patient via Caregility. Patient appears comfortable on the chair. AAOx4. AVS paperwork given to the patient. Discharge instructions discussed. Patients question answered to satisfaction. Patient agreeable with discharge plan.

## 2024-02-15 NOTE — Progress Notes (Signed)
 "  [facility] PROGRESS NOTE   Destiny Travis FMW:[id number] DOB: 1970/01/31 DOA: 02/14/2024  PCP: Joshua Francisco, MD  Brief History: 54 year old female HTN, chronic tobacco abuse, marijuana use who presented with left-sided weakness and slurred speech.  Found to have an acute stroke.  Hospitalized for further management.   Consultants: Neurology  Procedures: Echocardiogram is pending    Subjective/Interval History: Patient mentions that her speech is back to baseline.  The left-sided weakness is also significantly improved though not fully back to baseline.  She has been able to ambulate to the bathroom and back.  Tolerated her diet without any swallowing difficulties.    Assessment/Plan:  Acute stroke Patient presented with left-sided weakness and slurred speech.  Neurological deficits are improving. Neurology consulted. MRI brain showed no acute stroke.  CT angiogram did not show any large vessel occlusion. Patient noted to be on aspirin  and Plavix . LDL noted to be 93.  Started on atorvastatin . Urine drug screen positive for marijuana and cocaine.  Patient counseled. HbA1c 5.3. Echocardiogram is pending PT and OT eval is pending. Await stroke neurology input today.  Essential hypertension Takes ARB prior to admission.  Currently on hold.  History of alcohol abuse Counseled.  No evidence for withdrawal symptoms as yet.  Continue CIWA protocol.  Cocaine and marijuana abuse Patient admits to marijuana use but surprised with cocaine.  She was counseled.  Tobacco abuse Counseled.  DVT Prophylaxis: Subcutaneous heparin  Code Status: Full code Family Communication: Discussed with patient Disposition Plan: Home when cleared by neurology  Status is: Observation The patient remains OBS appropriate and may or may not d/c before 2 midnights.      Medications: Scheduled:  aspirin  EC  81 mg Oral Daily   atorvastatin   40 mg Oral Daily   clopidogrel   75 mg Oral  Daily   diphenhydrAMINE   12.5 mg Intravenous Once   folic acid   1 mg Oral Daily   heparin   5,000 Units Subcutaneous Q8H   multivitamin with minerals  1 tablet Oral Daily   pantoprazole   40 mg Oral Daily   sodium chloride  flush  3 mL Intravenous Q12H   sodium chloride  flush  3 mL Intravenous Q12H   thiamine   100 mg Oral Daily   Or   thiamine   100 mg Intravenous Daily   [START ON 02/18/2024] Vitamin D  (Ergocalciferol )  50,000 Units Oral Weekly   Continuous:  sodium chloride  100 mL/hr at 02/15/24 9374   PRN:acetaminophen  **OR** acetaminophen , HYDROmorphone  (DILAUDID ) injection, ipratropium, LORazepam  **OR** LORazepam , ondansetron  **OR** ondansetron  (ZOFRAN ) IV, oxyCODONE , senna-docusate, traZODone   Antibiotics: Anti-infectives (From admission, onward)    None       Objective:  Vital Signs  Vitals:   02/14/24 2041 02/14/24 2309 02/15/24 0624 02/15/24 0750  BP: (!) 149/105 134/68 (!) 166/89 (!) 146/87  Pulse: 82 83 76 78  Resp: 14 14 16 16   Temp: (!) 97.5 F (36.4 C) 97.7 F (36.5 C) 97.6 F (36.4 C) 98 F (36.7 C)  TempSrc: Oral Oral Oral Oral  SpO2: 96% 96% 98% 97%    Intake/Output Summary (Last 24 hours) at 02/15/2024 0956 Last data filed at 02/15/2024 0500 Gross per 24 hour  Intake 1295.28 ml  Output --  Net 1295.28 ml    General appearance: Awake alert.  In no distress Resp: Clear to auscultation bilaterally.  Normal effort Cardio: S1-S2 is normal regular.  No S3-S4.  No rubs murmurs or bruit GI: Abdomen is soft.  Nontender nondistended.  Bowel sounds are present  normal.  No masses organomegaly Extremities: No edema.  Full range of motion of lower extremities. Neurologic: Alert and oriented x3.  Mild left-sided deficits noted   Lab Results:  Data Reviewed: I have personally reviewed following labs and reports of the imaging studies  CBC: Recent Labs  Lab 02/14/24 0619 02/14/24 0628 02/15/24 0147  WBC 6.6  --  8.6  NEUTROABS 2.9  --   --   HGB 15.6*  15.3* 14.4  HCT 45.4 45.0 41.1  MCV 97.4  --  95.1  PLT 337  --  306    Basic Metabolic Panel: Recent Labs  Lab 02/14/24 0619 02/14/24 0628 02/14/24 2135 02/15/24 0147  NA 140 140  --  140  K 4.2 4.1  --  3.9  CL 104 104  --  105  CO2 21*  --   --  26  GLUCOSE 123* 113*  --  81  BUN 10 11  --  12  CREATININE 0.59 0.80  --  0.70  CALCIUM  10.3  --   --  9.2  MG  --   --  1.9  --   PHOS  --   --  3.6  --     GFR: CrCl cannot be calculated (Unknown ideal weight.).  Liver Function Tests: Recent Labs  Lab 02/14/24 0619  AST 29  ALT 30  ALKPHOS 89  BILITOT 0.2  PROT 7.6  ALBUMIN  4.6    Coagulation Profile: Recent Labs  Lab 02/14/24 0619  INR 1.0    BNP (last 3 results) Recent Labs    02/14/24 2135  PROBNP 62.4    HbA1C: Recent Labs    02/14/24 2135  HGBA1C 5.3    CBG: Recent Labs  Lab 02/14/24 0615  GLUCAP 120*    Lipid Profile: Recent Labs    02/15/24 0147  CHOL 185  HDL 64  LDLCALC 93  TRIG 141  CHOLHDL 2.9    Radiology Studies: MR BRAIN WO CONTRAST Result Date: 02/14/2024 EXAM: MRI BRAIN WITHOUT CONTRAST 02/14/2024 03:43:54 PM TECHNIQUE: Multiplanar multisequence MRI of the head/brain was performed without the administration of intravenous contrast. COMPARISON: Head CT and CTA 02/14/2024 and MRI 11/24/2003. CLINICAL HISTORY: Neuro deficit, acute, stroke suspected. FINDINGS: BRAIN AND VENTRICLES: There is a 1 cm acute infarct involving the lateral aspect of the right thalamus. No intracranial hemorrhage, mass, midline shift, hydrocephalus, or extra-axial fluid collection is identified. T2 hyperintensities in the cerebral white matter and pons are nonspecific but compatible with mild chronic small vessel ischemic disease. Cerebral volume is normal. Major intracranial vascular flow voids are preserved. ORBITS: No significant abnormality. SINUSES AND MASTOIDS: Mild to moderate mucosal thickening in the ethmoid and maxillary sinuses bilaterally.  Clear mastoid air cells. BONES AND SOFT TISSUES: Normal marrow signal. No soft tissue abnormality. IMPRESSION: 1. Acute right thalamic infarct. 2. Mild chronic small vessel ischemic disease. Electronically signed by: Dasie Hamburg MD 02/14/2024 04:24 PM EST RP Workstation: [id number]   CT ANGIO HEAD NECK W WO CM W PERF (CODE STROKE) LKW > 6h Result Date: 02/14/2024 EXAM: CTA Head and Neck with Perfusion 02/14/2024 06:50:38 AM TECHNIQUE: CTA of the head and neck was performed without and with the administration of intravenous contrast. 100 mL of iohexol  (OMNIPAQUE ) 350 MG/ML injection was administered. 3D postprocessing with multiplanar reconstructions and MIPs was performed to evaluate the vascular anatomy. Cerebral perfusion analysis using computed tomography with contrast administration, including post-processing of parametric maps with determination of cerebral blood flow, cerebral blood volume,  mean transit time and time-to-maximum. Automated exposure control, iterative reconstruction, and/or weight based adjustment of the mA/kV was utilized to reduce the radiation dose to as low as reasonably achievable. COMPARISON: CT of the head dated 02/14/2024. CLINICAL HISTORY: Neuro deficit, acute, stroke suspected. FINDINGS: CTA NECK: AORTIC ARCH AND ARCH VESSELS: No dissection or arterial injury. No significant stenosis of the brachiocephalic or subclavian arteries. CERVICAL CAROTID ARTERIES: No dissection, arterial injury, or hemodynamically significant stenosis by NASCET criteria. CERVICAL VERTEBRAL ARTERIES: No dissection, arterial injury, or significant stenosis. The vertebral arteries are codominant. LUNGS AND MEDIASTINUM: Unremarkable. SOFT TISSUES: No acute abnormality. BONES: No acute abnormality. CTA HEAD: ANTERIOR CIRCULATION: No significant stenosis of the internal carotid arteries. No significant stenosis of the anterior cerebral arteries. No significant stenosis of the middle cerebral arteries. No  aneurysm. POSTERIOR CIRCULATION: There is fetal type origin of each posterior cerebral artery. No significant stenosis of the posterior cerebral arteries. No significant stenosis of the basilar artery. No significant stenosis of the vertebral arteries. No aneurysm. OTHER: No dural venous sinus thrombosis on this non-dedicated study. CT PERFUSION: EXAM QUALITY: Exam quality is adequate with diagnostic perfusion maps. No significant motion artifact. Appropriate arterial inflow and venous outflow curves. CORE INFARCT (CBF<30% volume): 0 mL TOTAL HYPOPERFUSION (Tmax>6s volume): 0 mL PENUMBRA: Mismatch volume: 0 mL Mismatch ratio: not applicable Location: not applicable IMPRESSION: 1. No acute large vessel occlusion. 2. No hemodynamically significant stenosis or aneurysm in the head or neck vessels. 3. No evidence of ischemia by CT brain perfusion. Electronically signed by: Evalene Coho MD 02/14/2024 07:06 AM EST RP Workstation: [id number]   CT HEAD CODE STROKE WO CONTRAST (LKW 0-4.5h, LVO 0-24h) Result Date: 02/14/2024 EXAM: CT HEAD WITHOUT CONTRAST 02/14/2024 06:34:20 AM TECHNIQUE: CT of the head was performed without the administration of intravenous contrast. Automated exposure control, iterative reconstruction, and/or weight based adjustment of the mA/kV was utilized to reduce the radiation dose to as low as reasonably achievable. COMPARISON: CT of the head dated 08/01/2004. CLINICAL HISTORY: Neuro deficit, acute, stroke suspected. FINDINGS: BRAIN AND VENTRICLES: No acute hemorrhage. No evidence of acute infarct. No hydrocephalus. No extra-axial collection. No mass effect or midline shift. ORBITS: No acute abnormality. SINUSES: Mucosal thickening in paranasal sinuses. SOFT TISSUES AND SKULL: No acute soft tissue abnormality. No skull fracture. IMPRESSION: 1. No acute intracranial abnormality. 2. Mucosal thickening in the paranasal sinuses. Electronically signed by: Evalene Coho MD 02/14/2024 06:42 AM EST  RP Workstation: [id number]       LOS: 0 days   Joette Pebbles  [facility] Pager on [email or web address]  02/15/2024, 9:56 AM   "

## 2024-02-15 NOTE — Discharge Summary (Signed)
 " [facility]  Physician Discharge Summary   Patient ID: Destiny Travis MRN: [id number] DOB/AGE: 1970/06/08 54 y.o.  Admit date: 02/14/2024 Discharge date: 02/15/2024    PCP: Joshua Francisco, MD  DISCHARGE DIAGNOSES:    Acute CVA (cerebrovascular accident) [facility]   HTN (hypertension)   Substance abuse (HCC)   Tobacco abuse   Alcohol abuse   RECOMMENDATIONS FOR OUTPATIENT FOLLOW UP: Ambulatory referral sent to neurology.   Home Health: Outpatient PT OT Equipment/Devices: None  CODE STATUS: Full code  DISCHARGE CONDITION: fair  Diet recommendation: Heart healthy  INITIAL HISTORY:  54 year old female HTN, chronic tobacco abuse, marijuana use who presented with left-sided weakness and slurred speech.  Found to have an acute stroke.  Hospitalized for further management.   Consultants: Neurology   Procedures: Echocardiogram  HOSPITAL COURSE:   Acute stroke Patient presented with left-sided weakness and slurred speech.  Neurological deficits are improving. Neurology consulted. MRI brain showed no acute stroke.  CT angiogram did not show any large vessel occlusion. Patient noted to be on aspirin  and Plavix . LDL noted to be 93.  Started on atorvastatin . Urine drug screen positive for marijuana and cocaine.  Patient counseled. HbA1c 5.3. Echocardiogram shows normal LVEF. Seen by PT and OT.  Outpatient PT and OT recommended. Cleared by neurology. Aspirin  Plavix  for 3 weeks followed by aspirin  alone.   Essential hypertension Resume home medications   History of alcohol abuse Counseled.  No evidence for withdrawal symptoms as yet.     Cocaine and marijuana abuse Patient admits to marijuana use but surprised about the cocaine.  She was counseled.   Tobacco abuse Counseled.  Patient is stable.  Okay for discharge home today.   PERTINENT LABS:  The results of significant diagnostics from this hospitalization (including imaging, microbiology, ancillary and  laboratory) are listed below for reference.    Labs:   Basic Metabolic Panel: Recent Labs  Lab 02/14/24 0619 02/14/24 0628 02/14/24 2135 02/15/24 0147  NA 140 140  --  140  K 4.2 4.1  --  3.9  CL 104 104  --  105  CO2 21*  --   --  26  GLUCOSE 123* 113*  --  81  BUN 10 11  --  12  CREATININE 0.59 0.80  --  0.70  CALCIUM  10.3  --   --  9.2  MG  --   --  1.9  --   PHOS  --   --  3.6  --    Liver Function Tests: Recent Labs  Lab 02/14/24 0619  AST 29  ALT 30  ALKPHOS 89  BILITOT 0.2  PROT 7.6  ALBUMIN  4.6   CBC: Recent Labs  Lab 02/14/24 0619 02/14/24 0628 02/15/24 0147  WBC 6.6  --  8.6  NEUTROABS 2.9  --   --   HGB 15.6* 15.3* 14.4  HCT 45.4 45.0 41.1  MCV 97.4  --  95.1  PLT 337  --  306    ProBNP (last 3 results) Recent Labs    02/14/24 2135  PROBNP 62.4    CBG: Recent Labs  Lab 02/14/24 0615  GLUCAP 120*     IMAGING STUDIES ECHOCARDIOGRAM COMPLETE Result Date: 02/15/2024    ECHOCARDIOGRAM REPORT   Patient Name:   Destiny Travis Date of Exam: 02/15/2024 Medical Rec #:  [id number]      Height:       62.0 in Accession #:    [id number]     Weight:  137.0 lb Date of Birth:  12-Nov-1970      BSA:          1.628 m Patient Age:    53 years       BP:           166/79 mmHg Patient Gender: F              HR:           71 bpm. Exam Location:  Inpatient Procedure: 2D Echo, Cardiac Doppler and Color Doppler (Both Spectral and Color            Flow Doppler were utilized during procedure). Indications:    Stroke I63.9  History:        Patient has no prior history of Echocardiogram examinations.                 Signs/Symptoms:Hypertensive Heart Disease.  Sonographer:    Nathanel Devonshire Referring Phys: [phone] SEYED A SHAHMEHDI IMPRESSIONS  1. Left ventricular ejection fraction, by estimation, is 60 to 65%. The left ventricle has normal function. The left ventricle has no regional wall motion abnormalities. Left ventricular diastolic parameters were normal.  2. Right  ventricular systolic function is normal. The right ventricular size is normal. Tricuspid regurgitation signal is inadequate for assessing PA pressure.  3. The mitral valve is normal in structure. No evidence of mitral valve regurgitation. No evidence of mitral stenosis.  4. The aortic valve is tricuspid. Aortic valve regurgitation is not visualized. Aortic valve sclerosis/calcification is present, without any evidence of aortic stenosis. Aortic valve area, by VTI measures 2.52 cm. Aortic valve mean gradient measures 2.0 mmHg. Aortic valve Vmax measures 0.92 m/s.  5. The inferior vena cava is normal in size with greater than 50% respiratory variability, suggesting right atrial pressure of 3 mmHg. Conclusion(s)/Recommendation(s): No intracardiac source of embolism detected on this transthoracic study. Consider a transesophageal echocardiogram to exclude cardiac source of embolism if clinically indicated. FINDINGS  Left Ventricle: Left ventricular ejection fraction, by estimation, is 60 to 65%. The left ventricle has normal function. The left ventricle has no regional wall motion abnormalities. The left ventricular internal cavity size was normal in size. There is  no left ventricular hypertrophy. Left ventricular diastolic parameters were normal. Normal left ventricular filling pressure. Right Ventricle: The right ventricular size is normal. No increase in right ventricular wall thickness. Right ventricular systolic function is normal. Tricuspid regurgitation signal is inadequate for assessing PA pressure. Left Atrium: Left atrial size was normal in size. Right Atrium: Right atrial size was normal in size. Pericardium: There is no evidence of pericardial effusion. Mitral Valve: The mitral valve is normal in structure. No evidence of mitral valve regurgitation. No evidence of mitral valve stenosis. Tricuspid Valve: The tricuspid valve is normal in structure. Tricuspid valve regurgitation is not demonstrated. No  evidence of tricuspid stenosis. Aortic Valve: The aortic valve is tricuspid. Aortic valve regurgitation is not visualized. Aortic valve sclerosis/calcification is present, without any evidence of aortic stenosis. Aortic valve mean gradient measures 2.0 mmHg. Aortic valve peak gradient measures 3.4 mmHg. Aortic valve area, by VTI measures 2.52 cm. Pulmonic Valve: The pulmonic valve was normal in structure. Pulmonic valve regurgitation is not visualized. No evidence of pulmonic stenosis. Aorta: The aortic root is normal in size and structure. Venous: The inferior vena cava is normal in size with greater than 50% respiratory variability, suggesting right atrial pressure of 3 mmHg. IAS/Shunts: No atrial level shunt detected by color flow Doppler.  LEFT VENTRICLE PLAX 2D LVIDd:         4.30 cm     Diastology LVIDs:         2.80 cm     LV e' medial:   6.64 cm/s LV PW:         1.00 cm     LV E/e' medial: 9.3 LV IVS:        0.90 cm LVOT diam:     1.90 cm LV SV:         50 LV SV Index:   31 LVOT Area:     2.84 cm LV IVRT:       106 msec  LV Volumes (MOD) LV vol d, MOD A2C: 52.5 ml LV vol d, MOD A4C: 48.1 ml LV vol s, MOD A2C: 23.2 ml LV vol s, MOD A4C: 20.2 ml LV SV MOD A2C:     29.3 ml LV SV MOD A4C:     48.1 ml LV SV MOD BP:      28.6 ml RIGHT VENTRICLE          IVC RV Basal diam:  2.30 cm  IVC diam: 1.50 cm LEFT ATRIUM             Index LA diam:        2.60 cm 1.60 cm/m LA Vol (A2C):   29.5 ml 18.12 ml/m LA Vol (A4C):   15.9 ml 9.77 ml/m LA Biplane Vol: 22.6 ml 13.88 ml/m  AORTIC VALVE                    PULMONIC VALVE AV Area (Vmax):    2.96 cm     PV Vmax:       0.77 m/s AV Area (Vmean):   2.88 cm     PV Peak grad:  2.4 mmHg AV Area (VTI):     2.52 cm AV Vmax:           91.70 cm/s AV Vmean:          66.200 cm/s AV VTI:            0.200 m AV Peak Grad:      3.4 mmHg AV Mean Grad:      2.0 mmHg LVOT Vmax:         95.80 cm/s LVOT Vmean:        67.300 cm/s LVOT VTI:          0.178 m LVOT/AV VTI ratio: 0.89  AORTA  Ao Root diam: 3.30 cm Ao Asc diam:  3.30 cm MITRAL VALVE MV Area (PHT): 3.77 cm    SHUNTS MV Decel Time: 201 msec    Systemic VTI:  0.18 m MV E velocity: 61.80 cm/s  Systemic Diam: 1.90 cm MV A velocity: 60.80 cm/s MV E/A ratio:  1.02 Wilbert Bihari MD Electronically signed by Wilbert Bihari MD Signature Date/Time: 02/15/2024/12:12:41 PM    Final    MR BRAIN WO CONTRAST Result Date: 02/14/2024 EXAM: MRI BRAIN WITHOUT CONTRAST 02/14/2024 03:43:54 PM TECHNIQUE: Multiplanar multisequence MRI of the head/brain was performed without the administration of intravenous contrast. COMPARISON: Head CT and CTA 02/14/2024 and MRI 11/24/2003. CLINICAL HISTORY: Neuro deficit, acute, stroke suspected. FINDINGS: BRAIN AND VENTRICLES: There is a 1 cm acute infarct involving the lateral aspect of the right thalamus. No intracranial hemorrhage, mass, midline shift, hydrocephalus, or extra-axial fluid collection is identified. T2 hyperintensities in the cerebral white matter and pons are nonspecific but compatible with mild  chronic small vessel ischemic disease. Cerebral volume is normal. Major intracranial vascular flow voids are preserved. ORBITS: No significant abnormality. SINUSES AND MASTOIDS: Mild to moderate mucosal thickening in the ethmoid and maxillary sinuses bilaterally. Clear mastoid air cells. BONES AND SOFT TISSUES: Normal marrow signal. No soft tissue abnormality. IMPRESSION: 1. Acute right thalamic infarct. 2. Mild chronic small vessel ischemic disease. Electronically signed by: Dasie Hamburg MD 02/14/2024 04:24 PM EST RP Workstation: [id number]   CT ANGIO HEAD NECK W WO CM W PERF (CODE STROKE) LKW > 6h Result Date: 02/14/2024 EXAM: CTA Head and Neck with Perfusion 02/14/2024 06:50:38 AM TECHNIQUE: CTA of the head and neck was performed without and with the administration of intravenous contrast. 100 mL of iohexol  (OMNIPAQUE ) 350 MG/ML injection was administered. 3D postprocessing with multiplanar reconstructions and  MIPs was performed to evaluate the vascular anatomy. Cerebral perfusion analysis using computed tomography with contrast administration, including post-processing of parametric maps with determination of cerebral blood flow, cerebral blood volume, mean transit time and time-to-maximum. Automated exposure control, iterative reconstruction, and/or weight based adjustment of the mA/kV was utilized to reduce the radiation dose to as low as reasonably achievable. COMPARISON: CT of the head dated 02/14/2024. CLINICAL HISTORY: Neuro deficit, acute, stroke suspected. FINDINGS: CTA NECK: AORTIC ARCH AND ARCH VESSELS: No dissection or arterial injury. No significant stenosis of the brachiocephalic or subclavian arteries. CERVICAL CAROTID ARTERIES: No dissection, arterial injury, or hemodynamically significant stenosis by NASCET criteria. CERVICAL VERTEBRAL ARTERIES: No dissection, arterial injury, or significant stenosis. The vertebral arteries are codominant. LUNGS AND MEDIASTINUM: Unremarkable. SOFT TISSUES: No acute abnormality. BONES: No acute abnormality. CTA HEAD: ANTERIOR CIRCULATION: No significant stenosis of the internal carotid arteries. No significant stenosis of the anterior cerebral arteries. No significant stenosis of the middle cerebral arteries. No aneurysm. POSTERIOR CIRCULATION: There is fetal type origin of each posterior cerebral artery. No significant stenosis of the posterior cerebral arteries. No significant stenosis of the basilar artery. No significant stenosis of the vertebral arteries. No aneurysm. OTHER: No dural venous sinus thrombosis on this non-dedicated study. CT PERFUSION: EXAM QUALITY: Exam quality is adequate with diagnostic perfusion maps. No significant motion artifact. Appropriate arterial inflow and venous outflow curves. CORE INFARCT (CBF<30% volume): 0 mL TOTAL HYPOPERFUSION (Tmax>6s volume): 0 mL PENUMBRA: Mismatch volume: 0 mL Mismatch ratio: not applicable Location: not applicable  IMPRESSION: 1. No acute large vessel occlusion. 2. No hemodynamically significant stenosis or aneurysm in the head or neck vessels. 3. No evidence of ischemia by CT brain perfusion. Electronically signed by: Evalene Coho MD 02/14/2024 07:06 AM EST RP Workstation: [id number]   CT HEAD CODE STROKE WO CONTRAST (LKW 0-4.5h, LVO 0-24h) Result Date: 02/14/2024 EXAM: CT HEAD WITHOUT CONTRAST 02/14/2024 06:34:20 AM TECHNIQUE: CT of the head was performed without the administration of intravenous contrast. Automated exposure control, iterative reconstruction, and/or weight based adjustment of the mA/kV was utilized to reduce the radiation dose to as low as reasonably achievable. COMPARISON: CT of the head dated 08/01/2004. CLINICAL HISTORY: Neuro deficit, acute, stroke suspected. FINDINGS: BRAIN AND VENTRICLES: No acute hemorrhage. No evidence of acute infarct. No hydrocephalus. No extra-axial collection. No mass effect or midline shift. ORBITS: No acute abnormality. SINUSES: Mucosal thickening in paranasal sinuses. SOFT TISSUES AND SKULL: No acute soft tissue abnormality. No skull fracture. IMPRESSION: 1. No acute intracranial abnormality. 2. Mucosal thickening in the paranasal sinuses. Electronically signed by: Evalene Coho MD 02/14/2024 06:42 AM EST RP Workstation: [id number]    DISCHARGE EXAMINATION: See progress note from earlier  today  DISPOSITION: Home  Discharge Instructions     Ambulatory referral to Neurology   Complete by: As directed    An appointment is requested in approximately: 8 weeks for acute CVA   Ambulatory referral to Physical Therapy   Complete by: As directed    Pt and OT evaluate and treat  dx Stroke   Call MD for:  difficulty breathing, headache or visual disturbances   Complete by: As directed    Call MD for:  extreme fatigue   Complete by: As directed    Call MD for:  persistant dizziness or light-headedness   Complete by: As directed    Call MD for:  persistant  nausea and vomiting   Complete by: As directed    Call MD for:  severe uncontrolled pain   Complete by: As directed    Call MD for:  temperature >100.4   Complete by: As directed    Diet - low sodium heart healthy   Complete by: As directed    Discharge instructions   Complete by: As directed    Please take your medications as prescribed.  Please be sure to follow-up with your primary care provider.  A referral will be sent to the neurologist office for follow-up.  You were cared for by a hospitalist during your hospital stay. If you have any questions about your discharge medications or the care you received while you were in the hospital after you are discharged, you can call the unit and asked to speak with the hospitalist on call if the hospitalist that took care of you is not available. Once you are discharged, your primary care physician will handle any further medical issues. Please note that NO REFILLS for any discharge medications will be authorized once you are discharged, as it is imperative that you return to your primary care physician (or establish a relationship with a primary care physician if you do not have one) for your aftercare needs so that they can reassess your need for medications and monitor your lab values. If you do not have a primary care physician, you can call [phone] for a physician referral.   Increase activity slowly   Complete by: As directed          Allergies as of 02/15/2024       Reactions   Penicillins Anaphylaxis, Shortness Of Breath   (Tolerated cefazolin  in irrigation fluid for surgery 12/20/23)   Erythromycin Hives   Flagyl  [metronidazole ] Nausea And Vomiting        Medication List     STOP taking these medications    Clindamycin  Phosphate (1 Dose) vaginal cream   diclofenac 50 MG EC tablet Commonly known as: VOLTAREN   ibuprofen  800 MG tablet Commonly known as: ADVIL        TAKE these medications    acetaminophen  500 MG  tablet Commonly known as: TYLENOL  Take 1,000 mg by mouth every 6 (six) hours as needed for moderate pain (pain score 4-6) or mild pain (pain score 1-3).   albuterol 108 (90 Base) MCG/ACT inhaler Commonly known as: VENTOLIN HFA Inhale 2 puffs into the lungs every 4 (four) hours as needed for wheezing or shortness of breath.   aspirin  EC 81 MG tablet Take 1 tablet (81 mg total) by mouth daily. Swallow whole.   atorvastatin  40 MG tablet Commonly known as: LIPITOR Take 1 tablet (40 mg total) by mouth daily.   Centrum Silver 50+Women Tabs Take 1 tablet by mouth at  bedtime.   clopidogrel  75 MG tablet Commonly known as: PLAVIX  Take 1 tablet (75 mg total) by mouth daily for 21 days.   ergocalciferol  1.25 MG (50000 UT) capsule Commonly known as: VITAMIN D2 Take 50,000 Units by mouth once a week. Sunday's   estradiol  0.1 MG/GM vaginal cream Commonly known as: ESTRACE  VAGINAL Rub pea size amount each night for 3 weeks then 3 times a week thereafter. What changed:  how much to take when to take this additional instructions   folic acid  1 MG tablet Commonly known as: FOLVITE  Take 1 tablet (1 mg total) by mouth daily.   losartan 50 MG tablet Commonly known as: COZAAR Take 50 mg by mouth at bedtime.   methocarbamol 500 MG tablet Commonly known as: ROBAXIN Take 500-1,000 mg by mouth every 6 (six) hours as needed for muscle spasms.   Systane 0.4-0.3 % Soln Generic drug: Polyethyl Glycol-Propyl Glycol Apply 1 drop to eye 2 (two) times daily as needed (dry eyes).   thiamine  100 MG tablet Commonly known as: Vitamin B-1 Take 1 tablet (100 mg total) by mouth daily.   Trelegy Ellipta 100-62.5-25 MCG/ACT Aepb Generic drug: Fluticasone-Umeclidin-Vilant Inhale 2 puffs into the lungs at bedtime.          Follow-up Information     [facility] Follow up.   Specialty: Rehabilitation Why: referral sent- they will call to schedule Contact  information: [address] [place]  [phone]        [facility] Follow up.   Why: for primary care Contact information: [address] [place]  [address] [phone]                TOTAL DISCHARGE TIME: 35 minutes  Jamecia Lerman  [facility] Pager on [email or web address]  02/16/2024, 10:04 AM    "

## 2024-02-15 NOTE — TOC CAGE-AID Note (Signed)
 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 Patient has been working on cutting back, and was in agreement to counseling resources to help her work on developing better coping skills for the stress of caregiving. CSW also discussed with patient about finding support groups for caregivers, patient in agreement.    CAGE-AID Screening:    Have You Ever Felt You Ought to Cut Down on Your Drinking or Drug Use?: Yes Have People Annoyed You By Critizing Your Drinking Or Drug Use?: No Have You Felt Bad Or Guilty About Your Drinking Or Drug Use?: Yes Have You Ever Had a Drink or Used Drugs First Thing In The Morning to Steady Your Nerves or to Get Rid of a Hangover?: No CAGE-AID Score: 2  Substance Abuse Education Offered: Yes  Substance abuse interventions: Patient Counseling
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
## 2024-02-15 NOTE — Plan of Care (Signed)
" °  Problem: Education: Goal: Knowledge of disease or condition will improve Outcome: Progressing   Problem: Ischemic Stroke/TIA Tissue Perfusion: Goal: Complications of ischemic stroke/TIA will be minimized Outcome: Progressing   Problem: Health Behavior/Discharge Planning: Goal: Ability to manage health-related needs will improve Outcome: Progressing   Problem: Self-Care: Goal: Ability to communicate needs accurately will improve Outcome: Progressing   Problem: Nutrition: Goal: Risk of aspiration will decrease Outcome: Progressing   "

## 2024-02-16 ENCOUNTER — Telehealth: Payer: Self-pay

## 2024-02-16 DIAGNOSIS — I639 Cerebral infarction, unspecified: Secondary | ICD-10-CM

## 2024-02-16 NOTE — Transitions of Care (Post Inpatient/ED Visit) (Signed)
 02/16/2024  Patient ID: Destiny Travis, adult   DOB: 17-Apr-1970, 54 y.o.   MRN: [id number]  Patient called in for questions about medications.  She states that she has a slight headache and wondered if her losartan being held was the cause.  She has not checked her blood pressure but she will.  Reviewed medication with patient.  Her losartan was not being held per discharge instructions. Advised patient on this. She states she will take her medication today and check her blood pressure and record.  PCP follow up 02/19/24.  She voices no concerns.  No further interventions needed at this time.   Darcell Yacoub J. Stephenson Cichy RN, MSN [facility], [facility] RN Care Manager Direct Dial: [phone]  Fax: [phone] Website: [email or web address]

## 2024-02-26 ENCOUNTER — Encounter: Admitting: Obstetrics and Gynecology

## 2024-02-27 ENCOUNTER — Encounter: Admitting: Obstetrics and Gynecology

## 2024-02-28 ENCOUNTER — Ambulatory Visit: Admitting: Physical Therapy

## 2024-02-28 ENCOUNTER — Ambulatory Visit: Admitting: Occupational Therapy
